# Patient Record
Sex: Female | Born: 1975
Health system: Southern US, Community
[De-identification: ages and names within clinical notes are randomized; demographics above are authoritative.]

## PROBLEM LIST (undated history)

## (undated) DIAGNOSIS — N979 Female infertility, unspecified: Secondary | ICD-10-CM

## (undated) DIAGNOSIS — R42 Dizziness and giddiness: Secondary | ICD-10-CM

## (undated) DIAGNOSIS — G43909 Migraine, unspecified, not intractable, without status migrainosus: Secondary | ICD-10-CM

## (undated) HISTORY — DX: Migraine, unspecified, not intractable, without status migrainosus: G43.909

## (undated) HISTORY — DX: Female infertility, unspecified: N97.9

## (undated) HISTORY — DX: Dizziness and giddiness: R42

---

## 2013-08-30 ENCOUNTER — Other Ambulatory Visit (HOSPITAL_COMMUNITY): Payer: Self-pay | Admitting: Gynecology

## 2013-08-30 DIAGNOSIS — Z3141 Encounter for fertility testing: Secondary | ICD-10-CM

## 2013-09-16 ENCOUNTER — Ambulatory Visit (HOSPITAL_COMMUNITY)
Admission: RE | Admit: 2013-09-16 | Discharge: 2013-09-16 | Disposition: A | Discharge status: Home / Self Care | Payer: BC Managed Care – PPO | Source: Ambulatory Visit | Attending: Gynecology | Admitting: Gynecology

## 2013-09-16 DIAGNOSIS — Z3141 Encounter for fertility testing: Secondary | ICD-10-CM

## 2013-09-16 DIAGNOSIS — N979 Female infertility, unspecified: Secondary | ICD-10-CM | POA: Insufficient documentation

## 2013-09-16 MED ORDER — IOHEXOL 300 MG/ML  SOLN
10.0000 mL | Freq: Once | INTRAMUSCULAR | Status: AC | PRN
  Administered 2013-09-16: 15 mL

## 2013-12-20 ENCOUNTER — Encounter: Payer: Self-pay | Admitting: Internal Medicine

## 2013-12-20 ENCOUNTER — Ambulatory Visit (INDEPENDENT_AMBULATORY_CARE_PROVIDER_SITE_OTHER): Payer: BC Managed Care – PPO | Admitting: Internal Medicine

## 2013-12-20 ENCOUNTER — Other Ambulatory Visit: Payer: Self-pay | Admitting: Internal Medicine

## 2013-12-20 VITALS — BP 116/78 | HR 74 | Temp 98.4°F | Ht 64.0 in | Wt 113.0 lb

## 2013-12-20 DIAGNOSIS — N809 Endometriosis, unspecified: Secondary | ICD-10-CM | POA: Diagnosis not present

## 2013-12-20 DIAGNOSIS — N979 Female infertility, unspecified: Secondary | ICD-10-CM | POA: Diagnosis not present

## 2013-12-20 DIAGNOSIS — Z2251 Carrier of viral hepatitis B: Secondary | ICD-10-CM | POA: Diagnosis not present

## 2013-12-20 DIAGNOSIS — B181 Chronic viral hepatitis B without delta-agent: Secondary | ICD-10-CM

## 2013-12-20 DIAGNOSIS — B1911 Unspecified viral hepatitis B with hepatic coma: Secondary | ICD-10-CM

## 2013-12-20 LAB — COMPREHENSIVE METABOLIC PANEL
ALBUMIN: 4.4 g/dL (ref 3.5–5.2)
ALK PHOS: 43 U/L (ref 39–117)
ALT: 11 U/L (ref 0–35)
AST: 17 U/L (ref 0–37)
BUN: 19 mg/dL (ref 6–23)
CHLORIDE: 104 meq/L (ref 96–112)
CO2: 28 mEq/L (ref 19–32)
Calcium: 9.3 mg/dL (ref 8.4–10.5)
Creat: 0.71 mg/dL (ref 0.50–1.10)
GLUCOSE: 112 mg/dL — AB (ref 70–99)
POTASSIUM: 4 meq/L (ref 3.5–5.3)
SODIUM: 139 meq/L (ref 135–145)
TOTAL PROTEIN: 6.8 g/dL (ref 6.0–8.3)
Total Bilirubin: 0.7 mg/dL (ref 0.2–1.2)

## 2013-12-20 NOTE — Patient Instructions (Signed)
Diane Schultz should be tested for Hepatitis B surface antibody. A positive antibody shows protection against Hepatitis B.

## 2013-12-20 NOTE — Progress Notes (Signed)
Patient ID: Diane Schultz, female   DOB: 06/12/1975, 38 y.o.   MRN: [id number]         [facility]  Reason for Consult: Chronic hepatitis B Referring Physician: Dr. Teodora MediciHoward Schultz  Patient Active Problem List   Diagnosis Date Noted  . Hepatitis B carrier 12/20/2013    Priority: High  . Endometriosis 12/20/2013  . Infertility, female 12/20/2013    Patient's Medications  New Prescriptions   No medications on file  Previous Medications   PRENATAL VIT-FE FUMARATE-FA (PRENATAL MULTIVITAMIN) TABS TABLET    Take 1 tablet by mouth daily at 12 noon.  Modified Medications   No medications on file  Discontinued Medications   No medications on file    Recommendations: 1. Check lab work today including hepatitis B surface antigen and antibody, hepatitis B e antigen and antibody, complete metabolic panel, hepatitis A antibody and hepatitis C antibody 2. Abdominal ultrasound with elastography 3. Follow-up in 2 weeks  Assessment: Diane Schultz has chronic hepatitis B. I will need to check lab work and abdominal ultrasound to fully stage her infection. Chronic hepatitis B poses long-term risks to her but also the risk of possible vertical transmission to her fetus if she were to become pregnant. Once I have the results of her lab work and ultrasound I will see her back to counsel her about options for management. I've also suggested that Diane BusmanAllen be tested for hepatitis B surface antibody to make sure he is protected from his previous vaccination.   HPI: Diane Schultz is a 38 y.o. female who immigrated here from [place] about 6 months ago with her husband, Diane Schultz. Her mother had hepatitis B and she recalls being told that her liver enzymes have been intermittently elevated in the past. Many years ago she recalls having hepatitis B surface antigen and antibody test done but does not recall the results. She does not recall ever having any bouts of clinical hepatitis or jaundice.  She has never been treated for hepatitis B. She states that she has been in good health. They have a 24 and a half-year-old child who has been tested for hepatitis B and is negative. The child has been vaccinated against hepatitis B.  They've been trying to conceive a second child and currently undergoing evaluation for in vitro fertilization. As part of that evaluation she had a hepatitis B viral load done which was positive. It was repeated on November 16 by Dr. Chevis PrettyMezer and was slightly elevated at 490 IU/ml.   [name] states that he has been tested and is hepatitis B negative. He has also been vaccinated many years ago.  Review of Systems: Constitutional: positive for fatigue, negative for anorexia, chills, fevers, sweats and weight loss Eyes: negative Ears, nose, mouth, throat, and face: negative Respiratory: negative Cardiovascular: negative Gastrointestinal: negative Genitourinary:negative    Past Medical History  Diagnosis Date  . Infertility, female     History  Substance Use Topics  . Smoking status: Never Smoker   . Smokeless tobacco: Never Used  . Alcohol Use: 0.0 oz/week    0 Not specified per week     Comment: occasional    Family History  Problem Relation Age of Onset  . Hepatitis B Mother    No Known Allergies  OBJECTIVE: Blood pressure 116/78, pulse 74, temperature 98.4 F (36.9 C), temperature source Oral, height 5\' 4"  (1.626 m), weight 113 lb (51.256 kg). General: She is a thin, well-dressed female in no distress Skin: Pale  skin. No rashes Lymph nodes: No palpable adenopathy Lungs: Clear Cor: Regular S1 and S2 with no murmurs Abdomen: Soft and nontender. No liver, spleen or other masses palpable   Microbiology: No results found for this or any previous visit (from the past 240 hour(s)).  Cliffton AstersJohn Klaira Pesci, MD [facility] [phone] pager   [phone] cell 12/20/2013, 4:45 PM

## 2013-12-21 LAB — HEPATITIS C ANTIBODY: HCV Ab: NEGATIVE

## 2013-12-21 LAB — HEPATITIS B SURFACE ANTIBODY,QUALITATIVE: Hep B S Ab: NEGATIVE

## 2013-12-21 LAB — HEPATITIS B SURFACE ANTIGEN: Hepatitis B Surface Ag: POSITIVE — AB

## 2013-12-21 LAB — HEPATITIS A ANTIBODY, TOTAL: HEP A TOTAL AB: NONREACTIVE

## 2013-12-21 LAB — HEPATITIS B SURF AG CONFIRMATION: HEPATITIS B SURFACE ANTIGEN CONFIRMATION: POSITIVE — AB

## 2013-12-22 LAB — HEPATITIS B E ANTIGEN: HEPATITIS BE ANTIGEN: NONREACTIVE

## 2013-12-22 LAB — HEPATITIS B E ANTIBODY: Hepatitis Be Antibody: REACTIVE — AB

## 2013-12-30 ENCOUNTER — Ambulatory Visit (HOSPITAL_COMMUNITY)
Admission: RE | Admit: 2013-12-30 | Discharge: 2013-12-30 | Disposition: A | Discharge status: Home / Self Care | Payer: BC Managed Care – PPO | Source: Ambulatory Visit | Attending: Internal Medicine | Admitting: Internal Medicine

## 2013-12-30 DIAGNOSIS — B160 Acute hepatitis B with delta-agent with hepatic coma: Secondary | ICD-10-CM | POA: Insufficient documentation

## 2013-12-30 DIAGNOSIS — B1911 Unspecified viral hepatitis B with hepatic coma: Secondary | ICD-10-CM

## 2014-01-05 ENCOUNTER — Encounter: Payer: Self-pay | Admitting: Internal Medicine

## 2014-01-05 ENCOUNTER — Ambulatory Visit (INDEPENDENT_AMBULATORY_CARE_PROVIDER_SITE_OTHER): Payer: BC Managed Care – PPO | Admitting: Internal Medicine

## 2014-01-05 VITALS — BP 92/60 | HR 66 | Temp 98.6°F | Wt 115.5 lb

## 2014-01-05 DIAGNOSIS — Z2251 Carrier of viral hepatitis B: Secondary | ICD-10-CM

## 2014-01-05 DIAGNOSIS — B181 Chronic viral hepatitis B without delta-agent: Secondary | ICD-10-CM

## 2014-01-05 NOTE — Progress Notes (Signed)
Patient ID: Diane Schultz, female   DOB: 1975-08-29, 38 y.o.   MRN: [id number]         [facility]  Patient Active Problem List   Diagnosis Date Noted  . Hepatitis B carrier 12/20/2013    Priority: High  . Endometriosis 12/20/2013  . Infertility, female 12/20/2013    Patient's Medications  New Prescriptions   No medications on file  Previous Medications   PRENATAL VIT-FE FUMARATE-FA (PRENATAL MULTIVITAMIN) TABS TABLET    Take 1 tablet by mouth daily at 12 noon.  Modified Medications   No medications on file  Discontinued Medications   No medications on file    Subjective: Jaelen is in with her husband, [name], to review her test results. She continues to feel well. She continues to work on becoming pregnant but is currently not pregnant. [name] is scheduled for his routine physical in January and plans to have his hepatitis B surface antibody tested at that time.  Review of Systems: Pertinent items are noted in HPI.  Past Medical History  Diagnosis Date  . Infertility, female     History  Substance Use Topics  . Smoking status: Never Smoker   . Smokeless tobacco: Never Used  . Alcohol Use: 0.0 oz/week    0 Not specified per week     Comment: occasional    Family History  Problem Relation Age of Onset  . Hepatitis B Mother     No Known Allergies  Objective: Temp: 98.6 F (37 C) (12/23 1539) Temp Source: Oral (12/23 1539) BP: 92/60 mmHg (12/23 1539) Pulse Rate: 66 (12/23 1539)  General: She is in good spirits and her exam is unchanged and normal  Lab Results  Previous lab results: Hepatitis B surface antigen positive, hepatitis B surface antibody negative, hepatitis B DNA 490 IU/ml 12/20/2013:  Hepatitis B e antigen negative Hepatitis B e antibody positive Hepatitis A antibody negative Hepatitis C antibody negative  Lab Results  Component Value Date   ALT 11 12/20/2013   AST 17 12/20/2013   ALKPHOS 43 12/20/2013   BILITOT 0.7 12/20/2013   ULTRASOUND HEPATIC ELASTOGRAPHY 12/30/2013  TECHNIQUE: Sonography of the upper abdomen was performed. In addition, ultrasound elastography evaluation of the liver was performed. A region of interest was placed within the right lobe of the liver. Following application of a compressive sonographic pulse, shear waves were detected in the adjacent hepatic tissue and the shear wave velocity was calculated. Multiple assessments were performed at the selected site. Median shear wave velocity is correlated to a Metavir fibrosis score.  COMPARISON: None.  FINDINGS: ULTRASOUND ABDOMEN  Gallbladder: No gallstones, gallbladder wall thickening, or pericholecystic fluid. Negative sonographic Murphy's sign.  Common bile duct: Diameter: 3 mm  Liver: No focal lesion identified. Within normal limits in parenchymal echogenicity.  IVC: No abnormality visualized.  Pancreas: Visualized portion unremarkable.  Spleen: Size and appearance within normal limits.  Right Kidney: Length: 11.7 cm. 8 x 6 x 6 mm upper pole cyst. No hydronephrosis.  Left Kidney: Length: 10.9 cm. No mass or hydronephrosis.  Abdominal aorta: No aneurysm visualized.  Other findings: None.  ULTRASOUND HEPATIC ELASTOGRAPHY  Device: Siemens Helix VTQ  Transducer 4V1  Patient position: Left lateral decubitus  Number of measurements: 10  Hepatic Segment: 8  Median velocity: 1.65 m/sec  IQR: 0.41  IQR/Median velocity ratio 0.25  Corresponding Metavir fibrosis score: F2/F3  Risk of fibrosis: Moderate  Limitations of exam: Additional testing appropriate.  Pertinent findings noted on  other imaging exams: None  Please note that abnormal shear wave velocities may also be identified in clinical settings other than with hepatic fibrosis, such as: acute hepatitis, elevated right heart and central venous pressures including use of beta blockers,  veno-occlusive disease (Budd-Chiari), infiltrative processes such as mastocytosis/amyloidosis/infiltrative tumor, extrahepatic cholestasis, in the post-prandial state, and liver transplantation. Correlation with patient history, laboratory data, and clinical condition recommended.  IMPRESSION: Negative abdominal ultrasound.  Median hepatic shear wave velocity is calculated at 1.65 m/sec.  Corresponding Metavir fibrosis score is F2/F3.  Risk of fibrosis is moderate.  Follow-up: Additional testing appropriate.   Electronically Signed  By: Charline BillsSriyesh Krishnan M.D.  On: 12/30/2013 11:26    Assessment: She has chronic hepatitis B with negative E antigen, normal liver enzymes and a very low hepatitis B DNA viral load. This puts her at low risk for vertical transmission if she becomes pregnant. Current guidelines do not recommend therapy to reduce the risk of vertical transmission but if she does become pregnant and the liver her baby should receive hepatitis B immune globulin and the standard hepatitis B vaccination series.  Her fibrosis score of F2/F3 needs further evaluation. I [facility] [place]  Plan: 1. Start hepatitis A vaccine 2. Hepatology clinic referral 3. Follow-up here after that evaluation   Cliffton AstersJohn Garlene Apperson, MD [facility] [phone] pager   [phone] cell 01/05/2014, 3:59 PM

## 2014-01-12 ENCOUNTER — Telehealth: Payer: Self-pay | Admitting: *Deleted

## 2014-01-12 NOTE — Telephone Encounter (Signed)
Pt will be called to arrange appointment date and time.  [facility] will notify [facility] with date and time of appt.

## 2014-01-20 ENCOUNTER — Telehealth: Payer: Self-pay | Admitting: *Deleted

## 2014-01-20 NOTE — Telephone Encounter (Signed)
Needing to make f/u appt w/ Dr. Orvan Falconerampbell.  [facility].  Dr Orvan Falconerampbell wants to see her 1-2 weeks after this appt.  Left message to call for appt first or second week in March 2016.

## 2014-01-31 ENCOUNTER — Telehealth: Payer: Self-pay | Admitting: *Deleted

## 2014-01-31 NOTE — Telephone Encounter (Signed)
Patient's husband, [name] called and left voice mail regarding a letter of clearance from Dr. Orvan Falconerampbell for IVF treatments. Called patient back and left voice mail stating I did not see a mention of the letter in Dr. Blair Dolphinampbell's note and they should request it from the hepatologist that the patient is scheduled to see on 03/11/14. Dr. Orvan Falconerampbell does want to see patient 2nd week in March and advised him to call back to schedule this appointment. Wendall MolaJacqueline Cockerham CMA

## 2014-01-31 NOTE — Telephone Encounter (Signed)
Patient's husband called back stating he did discuss with Dr. Orvan Falconerampbell a clearance letter to proceed with IVF; please advise.

## 2014-01-31 NOTE — Telephone Encounter (Signed)
error 

## 2014-02-03 ENCOUNTER — Encounter: Payer: Self-pay | Admitting: Internal Medicine

## 2014-02-03 NOTE — Telephone Encounter (Signed)
Patient notified that letter was faxed electronically to Dr. Chevis PrettyMezer at [facility] and a copy was mailed to his home. Diane MolaJacqueline Cockerham

## 2015-11-25 DIAGNOSIS — Z23 Encounter for immunization: Secondary | ICD-10-CM | POA: Diagnosis not present

## 2016-04-26 DIAGNOSIS — N809 Endometriosis, unspecified: Secondary | ICD-10-CM | POA: Diagnosis not present

## 2016-04-26 DIAGNOSIS — R102 Pelvic and perineal pain: Secondary | ICD-10-CM | POA: Diagnosis not present

## 2016-05-22 DIAGNOSIS — Z30430 Encounter for insertion of intrauterine contraceptive device: Secondary | ICD-10-CM | POA: Diagnosis not present

## 2016-05-22 DIAGNOSIS — Z3202 Encounter for pregnancy test, result negative: Secondary | ICD-10-CM | POA: Diagnosis not present

## 2016-05-30 DIAGNOSIS — Z6820 Body mass index (BMI) 20.0-20.9, adult: Secondary | ICD-10-CM | POA: Diagnosis not present

## 2016-05-30 DIAGNOSIS — Z01419 Encounter for gynecological examination (general) (routine) without abnormal findings: Secondary | ICD-10-CM | POA: Diagnosis not present

## 2016-08-28 DIAGNOSIS — Z1231 Encounter for screening mammogram for malignant neoplasm of breast: Secondary | ICD-10-CM | POA: Diagnosis not present

## 2016-08-28 DIAGNOSIS — Z30431 Encounter for routine checking of intrauterine contraceptive device: Secondary | ICD-10-CM | POA: Diagnosis not present

## 2016-11-19 DIAGNOSIS — G245 Blepharospasm: Secondary | ICD-10-CM | POA: Diagnosis not present

## 2016-11-19 DIAGNOSIS — H2513 Age-related nuclear cataract, bilateral: Secondary | ICD-10-CM | POA: Diagnosis not present

## 2016-12-09 DIAGNOSIS — R05 Cough: Secondary | ICD-10-CM | POA: Diagnosis not present

## 2017-06-30 DIAGNOSIS — Z1231 Encounter for screening mammogram for malignant neoplasm of breast: Secondary | ICD-10-CM | POA: Diagnosis not present

## 2017-07-07 DIAGNOSIS — N7011 Chronic salpingitis: Secondary | ICD-10-CM | POA: Diagnosis not present

## 2017-10-20 DIAGNOSIS — D509 Iron deficiency anemia, unspecified: Secondary | ICD-10-CM | POA: Diagnosis not present

## 2017-10-20 DIAGNOSIS — N7011 Chronic salpingitis: Secondary | ICD-10-CM | POA: Diagnosis not present

## 2017-10-31 DIAGNOSIS — R42 Dizziness and giddiness: Secondary | ICD-10-CM | POA: Diagnosis not present

## 2017-10-31 DIAGNOSIS — J343 Hypertrophy of nasal turbinates: Secondary | ICD-10-CM | POA: Diagnosis not present

## 2017-10-31 DIAGNOSIS — Z7289 Other problems related to lifestyle: Secondary | ICD-10-CM | POA: Diagnosis not present

## 2018-01-01 DIAGNOSIS — N7011 Chronic salpingitis: Secondary | ICD-10-CM | POA: Diagnosis not present

## 2018-01-01 DIAGNOSIS — N809 Endometriosis, unspecified: Secondary | ICD-10-CM | POA: Diagnosis not present

## 2018-01-01 DIAGNOSIS — N915 Oligomenorrhea, unspecified: Secondary | ICD-10-CM | POA: Diagnosis not present

## 2018-09-18 DIAGNOSIS — N801 Endometriosis of ovary: Secondary | ICD-10-CM | POA: Diagnosis not present

## 2018-10-12 DIAGNOSIS — Z23 Encounter for immunization: Secondary | ICD-10-CM | POA: Diagnosis not present

## 2018-10-12 DIAGNOSIS — Z13220 Encounter for screening for lipoid disorders: Secondary | ICD-10-CM | POA: Diagnosis not present

## 2018-10-12 DIAGNOSIS — Z0000 Encounter for general adult medical examination without abnormal findings: Secondary | ICD-10-CM | POA: Diagnosis not present

## 2018-10-13 DIAGNOSIS — Z01419 Encounter for gynecological examination (general) (routine) without abnormal findings: Secondary | ICD-10-CM | POA: Diagnosis not present

## 2018-10-13 DIAGNOSIS — Z1231 Encounter for screening mammogram for malignant neoplasm of breast: Secondary | ICD-10-CM | POA: Diagnosis not present

## 2018-10-13 DIAGNOSIS — Z3202 Encounter for pregnancy test, result negative: Secondary | ICD-10-CM | POA: Diagnosis not present

## 2018-10-13 DIAGNOSIS — R5383 Other fatigue: Secondary | ICD-10-CM | POA: Diagnosis not present

## 2018-10-13 DIAGNOSIS — N915 Oligomenorrhea, unspecified: Secondary | ICD-10-CM | POA: Diagnosis not present

## 2018-10-13 DIAGNOSIS — Z6822 Body mass index (BMI) 22.0-22.9, adult: Secondary | ICD-10-CM | POA: Diagnosis not present

## 2018-10-27 DIAGNOSIS — G4700 Insomnia, unspecified: Secondary | ICD-10-CM | POA: Diagnosis not present

## 2019-02-04 ENCOUNTER — Encounter (HOSPITAL_BASED_OUTPATIENT_CLINIC_OR_DEPARTMENT_OTHER): Payer: Self-pay

## 2019-02-04 ENCOUNTER — Emergency Department (HOSPITAL_BASED_OUTPATIENT_CLINIC_OR_DEPARTMENT_OTHER)
Admission: EM | Admit: 2019-02-04 | Discharge: 2019-02-04 | Disposition: A | Discharge status: Home / Self Care | Payer: BC Managed Care – PPO | Attending: Emergency Medicine | Admitting: Emergency Medicine

## 2019-02-04 ENCOUNTER — Other Ambulatory Visit: Payer: Self-pay

## 2019-02-04 ENCOUNTER — Emergency Department (HOSPITAL_BASED_OUTPATIENT_CLINIC_OR_DEPARTMENT_OTHER): Payer: BC Managed Care – PPO

## 2019-02-04 ENCOUNTER — Inpatient Hospital Stay: Admission: RE | Admit: 2019-02-04 | Payer: Self-pay | Source: Ambulatory Visit

## 2019-02-04 ENCOUNTER — Other Ambulatory Visit: Payer: Self-pay | Admitting: Physician Assistant

## 2019-02-04 DIAGNOSIS — N202 Calculus of kidney with calculus of ureter: Secondary | ICD-10-CM | POA: Diagnosis not present

## 2019-02-04 DIAGNOSIS — R109 Unspecified abdominal pain: Secondary | ICD-10-CM | POA: Diagnosis not present

## 2019-02-04 DIAGNOSIS — N201 Calculus of ureter: Secondary | ICD-10-CM | POA: Diagnosis not present

## 2019-02-04 DIAGNOSIS — R1032 Left lower quadrant pain: Secondary | ICD-10-CM | POA: Diagnosis not present

## 2019-02-04 LAB — COMPREHENSIVE METABOLIC PANEL
ALT: 26 U/L (ref 0–44)
AST: 25 U/L (ref 15–41)
Albumin: 4.7 g/dL (ref 3.5–5.0)
Alkaline Phosphatase: 51 U/L (ref 38–126)
Anion gap: 9 (ref 5–15)
BUN: 16 mg/dL (ref 6–20)
CO2: 21 mmol/L — ABNORMAL LOW (ref 22–32)
Calcium: 9.1 mg/dL (ref 8.9–10.3)
Chloride: 104 mmol/L (ref 98–111)
Creatinine, Ser: 0.92 mg/dL (ref 0.44–1.00)
GFR calc Af Amer: >60 mL/min (ref >60)
GFR calc non Af Amer: >60 mL/min (ref >60)
Glucose, Bld: 107 mg/dL — ABNORMAL HIGH (ref 70–99)
Potassium: 3.5 mmol/L (ref 3.5–5.1)
Sodium: 134 mmol/L — ABNORMAL LOW (ref 135–145)
Total Bilirubin: 1.6 mg/dL — ABNORMAL HIGH (ref 0.3–1.2)
Total Protein: 7.8 g/dL (ref 6.5–8.1)

## 2019-02-04 LAB — CBC WITH DIFFERENTIAL/PLATELET
Abs Immature Granulocytes: 0.02 10*3/uL (ref 0.00–0.07)
Basophils Absolute: 0 10*3/uL (ref 0.0–0.1)
Basophils Relative: 0 %
Eosinophils Absolute: 0 10*3/uL (ref 0.0–0.5)
Eosinophils Relative: 0 %
HCT: 36.6 % (ref 36.0–46.0)
Hemoglobin: 12.2 g/dL (ref 12.0–15.0)
Immature Granulocytes: 0 %
Lymphocytes Relative: 8 %
Lymphs Abs: 0.7 10*3/uL (ref 0.7–4.0)
MCH: 32.8 pg (ref 26.0–34.0)
MCHC: 33.3 g/dL (ref 30.0–36.0)
MCV: 98.4 fL (ref 80.0–100.0)
Monocytes Absolute: 0.2 10*3/uL (ref 0.1–1.0)
Monocytes Relative: 3 %
Neutro Abs: 7.5 10*3/uL (ref 1.7–7.7)
Neutrophils Relative %: 89 %
Platelets: 207 10*3/uL (ref 150–400)
RBC: 3.72 MIL/uL — ABNORMAL LOW (ref 3.87–5.11)
RDW: 11.7 % (ref 11.5–15.5)
WBC: 8.4 10*3/uL (ref 4.0–10.5)
nRBC: 0 % (ref 0.0–0.2)

## 2019-02-04 LAB — URINALYSIS, ROUTINE W REFLEX MICROSCOPIC
Bilirubin Urine: NEGATIVE
Glucose, UA: NEGATIVE mg/dL
Ketones, ur: 40 mg/dL — AB
Leukocytes,Ua: NEGATIVE
Nitrite: NEGATIVE
Protein, ur: NEGATIVE mg/dL
Specific Gravity, Urine: 1.025 (ref 1.005–1.030)
pH: 6 (ref 5.0–8.0)

## 2019-02-04 LAB — PREGNANCY, URINE: Preg Test, Ur: NEGATIVE

## 2019-02-04 LAB — URINALYSIS, MICROSCOPIC (REFLEX): WBC, UA: NONE SEEN WBC/hpf (ref 0–5)

## 2019-02-04 MED ORDER — IBUPROFEN 600 MG PO TABS: 600.0000 mg | ORAL_TABLET | Freq: Four times a day (QID) | ORAL | 0 refills | Status: DC | PRN

## 2019-02-04 MED ORDER — SODIUM CHLORIDE 0.9 % IV BOLUS
1000.0000 mL | Freq: Once | INTRAVENOUS | Status: AC
  Administered 2019-02-04: 1000 mL via INTRAVENOUS

## 2019-02-04 MED ORDER — TAMSULOSIN HCL 0.4 MG PO CAPS: 0.4000 mg | ORAL_CAPSULE | Freq: Every day | ORAL | 0 refills | Status: DC

## 2019-02-04 MED ORDER — ONDANSETRON HCL 4 MG/2ML IJ SOLN
4.0000 mg | Freq: Once | INTRAMUSCULAR | Status: AC
  Administered 2019-02-04: 4 mg via INTRAVENOUS
  Filled 2019-02-04: qty 2

## 2019-02-04 MED ORDER — HYDROCODONE-ACETAMINOPHEN 5-325 MG PO TABS: 1.0000 | ORAL_TABLET | Freq: Four times a day (QID) | ORAL | 0 refills | Status: DC | PRN

## 2019-02-04 MED ORDER — HYDROMORPHONE HCL 1 MG/ML IJ SOLN
1.0000 mg | Freq: Once | INTRAMUSCULAR | Status: AC
  Administered 2019-02-04: 1 mg via INTRAVENOUS
  Filled 2019-02-04: qty 1

## 2019-02-04 MED ORDER — KETOROLAC TROMETHAMINE 30 MG/ML IJ SOLN
30.0000 mg | Freq: Once | INTRAMUSCULAR | Status: AC
  Administered 2019-02-04: 30 mg via INTRAVENOUS
  Filled 2019-02-04: qty 1

## 2019-02-04 MED ORDER — ONDANSETRON HCL 4 MG PO TABS: 4.0000 mg | ORAL_TABLET | Freq: Three times a day (TID) | ORAL | 0 refills | Status: DC | PRN

## 2019-02-04 NOTE — Discharge Instructions (Addendum)
1. Medications: Alternate 600 mg of ibuprofen and [phone] mg of Tylenol every 3 hours as needed for pain. Do not exceed 4000 mg of Tylenol daily.  Take ibuprofen with food to avoid upset stomach issues.  You can take Norco (hydrocodone-acetaminophen) as needed for severe pain but do not drive, drink alcohol, or operate heavy machinery while taking this medicine as it can cause drowsiness.  Be aware this medicine also contains Tylenol.  Take Zofran as needed for nausea.  Let this medicine dissolve under your tongue and wait around 10-15 minutes before eating or drinking after taking this medication to give it time to work.    Take Flomax as prescribed.  This medicine will open up the ureter(the tube connecting the bladder to the kidney) to allow for easier passage of the kidney stone.  Be aware this medication can cause a drop in your blood pressure so be careful when going from laying or sitting to standing as you may feel lightheaded.  If you feel very fatigued, lightheaded, or have persistently low blood pressures, stop taking this medication entirely.  You do not have to keep taking the Flomax once the stone is passed.  2. Treatment: rest, drink plenty of fluids.  Strain the urine to attempt to capture your kidney stone  3. Follow Up: Please followup with urology as soon as possible (preferably this week) for discussion of your diagnoses and further evaluation after today's visit; Please return to the ER for persistent vomiting, high fevers or worsening symptoms

## 2019-02-04 NOTE — ED Provider Notes (Signed)
[place] EMERGENCY DEPARTMENT Provider Note   CSN: [id number] Arrival date & time: 02/04/19  1445     History Chief Complaint  Patient presents with  . Flank Pain    Diane Schultz is a 44 y.o. female with history of endometriosis presents for evaluation of acute onset, progressively worsening left flank pain for 4 days.  Reports that symptoms have worsened since last night.  Pain is intermittent, sharp and stabbing.  It radiates from the left groin up to the left flank and left side of the low back.  No recent injuries.  She has felt subjective chills but when she checked her temperature she was afebrile.  Did have 2 episodes of nonbloody nonbilious emesis yesterday which she states is not unusual for her when she has severe pain.  She denies any urinary symptoms, vaginal itching, bleeding, or discharge.  She took 400 mg of ibuprofen yesterday with resolution of symptoms but they returned 3 hours later.  Denies bowel or bladder incontinence, saddle anesthesia, or history of IV drug use.  The history is provided by the patient.       Past Medical History:  Diagnosis Date  . Infertility, female     Patient Active Problem List   Diagnosis Date Noted  . Hepatitis B carrier ([place]) 12/20/2013  . Endometriosis 12/20/2013  . Infertility, female 12/20/2013    History reviewed. No pertinent surgical history.   OB History   No obstetric history on file.     Family History  Problem Relation Age of Onset  . Hepatitis B Mother     Social History   Tobacco Use  . Smoking status: Never Smoker  . Smokeless tobacco: Never Used  Substance Use Topics  . Alcohol use: Not Currently    Alcohol/week: 0.0 standard drinks    Comment: occasional  . Drug use: No    Home Medications Prior to Admission medications   Medication Sig Start Date End Date Taking? Authorizing Provider  HYDROcodone-acetaminophen (NORCO/VICODIN) 5-325 MG tablet Take 1 tablet by mouth every 6 (six)  hours as needed for severe pain. 02/04/19   Nelle Sayed A, PA-C  ibuprofen (ADVIL) 600 MG tablet Take 1 tablet (600 mg total) by mouth every 6 (six) hours as needed. 02/04/19   Nils Flack, Santiel Topper A, PA-C  ondansetron (ZOFRAN) 4 MG tablet Take 1 tablet (4 mg total) by mouth every 8 (eight) hours as needed for nausea or vomiting. 02/04/19   Rodell Perna A, PA-C  Prenatal Vit-Fe Fumarate-FA (PRENATAL MULTIVITAMIN) TABS tablet Take 1 tablet by mouth daily at 12 noon.    [provider]  tamsulosin (FLOMAX) 0.4 MG CAPS capsule Take 1 capsule (0.4 mg total) by mouth daily after supper. 02/04/19   Rodell Perna A, PA-C    Allergies    Patient has no known allergies.  Review of Systems   Review of Systems  Constitutional: Positive for chills. Negative for fever.  Respiratory: Negative for shortness of breath.   Cardiovascular: Negative for chest pain.  Gastrointestinal: Positive for abdominal pain, constipation, nausea and vomiting. Negative for diarrhea.  Genitourinary: Positive for flank pain. Negative for vaginal bleeding, vaginal discharge and vaginal pain.  All other systems reviewed and are negative.   Physical Exam Updated Vital Signs BP 124/72 (BP Location: Left Arm)   Pulse 61   Temp 98.8 F (37.1 C) (Oral)   Resp 15   Ht 5\' 3"  (1.6 m)   Wt 56.7 kg   LMP 01/03/2019  SpO2 95%   BMI 22.14 kg/m   Physical Exam Vitals and nursing note reviewed.  Constitutional:      General: She is not in acute distress.    Appearance: She is well-developed.  HENT:     Head: Normocephalic and atraumatic.  Eyes:     General:        Right eye: No discharge.        Left eye: No discharge.     Conjunctiva/sclera: Conjunctivae normal.  Neck:     Vascular: No JVD.     Trachea: No tracheal deviation.  Cardiovascular:     Rate and Rhythm: Normal rate and regular rhythm.     Pulses: Normal pulses.     Comments: 2+ radial and DP/PT pulses bilaterally, no lower extremity edema, no palpable cords,  compartments are soft  Pulmonary:     Effort: Pulmonary effort is normal.     Breath sounds: Normal breath sounds.  Abdominal:     General: Abdomen is flat. Bowel sounds are normal. There is no distension.     Palpations: Abdomen is soft.     Tenderness: There is abdominal tenderness in the left upper quadrant and left lower quadrant. There is left CVA tenderness. There is no guarding or rebound.     Comments: Mild discomfort on palpation of the left upper and lower quadrants of the abdomen and left flank.  Musculoskeletal:     Cervical back: Neck supple.     Comments: No midline lumbar spine tenderness, left paralumbar muscle tenderness.  No deformity, crepitus, or step-off.  5/5 strength of BLE major muscle groups.  Skin:    General: Skin is warm and dry.     Findings: No erythema.  Neurological:     Mental Status: She is alert.  Psychiatric:        Behavior: Behavior normal.     ED Results / Procedures / Treatments   Labs (all labs ordered are listed, but only abnormal results are displayed) Labs Reviewed  URINALYSIS, ROUTINE W REFLEX MICROSCOPIC - Abnormal; Notable for the following components:      Result Value   Hgb urine dipstick MODERATE (*)    Ketones, ur 40 (*)    All other components within normal limits  COMPREHENSIVE METABOLIC PANEL - Abnormal; Notable for the following components:   Sodium 134 (*)    CO2 21 (*)    Glucose, Bld 107 (*)    Total Bilirubin 1.6 (*)    All other components within normal limits  CBC WITH DIFFERENTIAL/PLATELET - Abnormal; Notable for the following components:   RBC 3.72 (*)    All other components within normal limits  URINALYSIS, MICROSCOPIC (REFLEX) - Abnormal; Notable for the following components:   Bacteria, UA FEW (*)    All other components within normal limits  PREGNANCY, URINE    EKG None  Radiology CT Renal Stone Study  Result Date: 02/04/2019 CLINICAL DATA:  Left-sided flank pain for several days EXAM: CT ABDOMEN  AND PELVIS WITHOUT CONTRAST TECHNIQUE: Multidetector CT imaging of the abdomen and pelvis was performed following the standard protocol without IV contrast. COMPARISON:  None. FINDINGS: Lower chest: No acute abnormality. Hepatobiliary: No focal liver abnormality is seen. No gallstones, gallbladder wall thickening, or biliary dilatation. Pancreas: Unremarkable. No pancreatic ductal dilatation or surrounding inflammatory changes. Spleen: Normal in size without focal abnormality. Adrenals/Urinary Tract: Adrenal glands are within normal limits. The left kidney demonstrates hydronephrosis and proximal hydroureter secondary to a 5 mm proximal  left ureteral stone best seen on image number 41 of series 4 and image number 41 of series 2. Scattered tiny nonobstructing renal calculi are noted bilaterally. The right ureter is within normal limits. The bladder is decompressed. Stomach/Bowel: The appendix is within normal limits. No obstructive or inflammatory changes of the large or small bowel are seen. The stomach is within normal limits. Vascular/Lymphatic: No significant vascular findings are present. No enlarged abdominal or pelvic lymph nodes. Reproductive: Uterus is somewhat retroflexed. No adnexal mass is noted. Other: No abdominal wall hernia or abnormality. No abdominopelvic ascites. Musculoskeletal: No acute or significant osseous findings. IMPRESSION: 5 mm proximal left ureteral stone with obstructive change. Tiny nonobstructing renal stones bilaterally. No other focal abnormality is noted. Electronically Signed   By: Alcide Clever M.D.   On: 02/04/2019 15:43    Procedures Procedures (including critical care time)  Medications Ordered in ED Medications  HYDROmorphone (DILAUDID) injection 1 mg (1 mg Intravenous Given 02/04/19 1545)  ondansetron (ZOFRAN) injection 4 mg (4 mg Intravenous Given 02/04/19 1545)  sodium chloride 0.9 % bolus 1,000 mL (0 mLs Intravenous Stopped 02/04/19 1721)  ketorolac (TORADOL) 30  MG/ML injection 30 mg (30 mg Intravenous Given 02/04/19 1609)    ED Course  I have reviewed the triage vital signs and the nursing notes.  Pertinent labs & imaging results that were available during my care of the patient were reviewed by me and considered in my medical decision making (see chart for details).    MDM Rules/Calculators/A&P                      Patient presenting for evaluation of left flank pain or abdominal pain for 4 days.  She is afebrile, vital signs are stable.  She is nontoxic in appearance.  No peritoneal signs on examination of the abdomen.  No midline lumbar spine tenderness and no red flag signs concerning for cauda equina or spinal abscess.  She is neurovascularly intact.  Lab work today reviewed by me shows no leukocytosis, no anemia, no metabolic derangements, no renal insufficiency.  Lipase and AST/ALT are within normal limits.  UA shows moderate hemoglobin, mild ketonuria and a few bacteria but not obviously infected. CT scan shows 5 mm proximal obstructing left ureteral stone as well as tiny nonobstructing renal stones bilaterally.  On reevaluation patient resting comfortably in no apparent distress, reports that she is feeling much better.  She was given IV fluids, Zofran, Dilaudid, Toradol in the ED.  She is tolerating p.o. fluids in the ED without difficulty.  Serial abdominal examinations are benign.  Discussed close follow-up with urology.  Will discharge with course of Flomax, Zofran, ibuprofen, as well as small amount of hydrocodone for severe breakthrough pain.  Advised of appropriate use of medications.  She will follow-up with urology on an outpatient basis.  Discussed strict ED return precautions. Patient verbalized understanding of and agreement with plan and is safe for discharge home at this time.  Final Clinical Impression(s) / ED Diagnoses Final diagnoses:  Ureterolithiasis    Rx / DC Orders ED Discharge Orders         Ordered    ondansetron  (ZOFRAN) 4 MG tablet  Every 8 hours PRN     02/04/19 1722    HYDROcodone-acetaminophen (NORCO/VICODIN) 5-325 MG tablet  Every 6 hours PRN     02/04/19 1722    tamsulosin (FLOMAX) 0.4 MG CAPS capsule  Daily after supper     02/04/19 1722  ibuprofen (ADVIL) 600 MG tablet  Every 6 hours PRN     02/04/19 1722           Bennye Alm 02/04/19 1742    Raeford Razor, MD 02/04/19 2317

## 2019-02-04 NOTE — ED Triage Notes (Signed)
Pt c/o L lumbar/flank pain with occasional radiation to the groin. Pain has been present since 1/18, but became severe last PM. Denies injury, urinary symptoms.

## 2019-02-04 NOTE — ED Notes (Signed)
Gave pt ginger ale  per RN for fluid challange

## 2019-02-10 DIAGNOSIS — N200 Calculus of kidney: Secondary | ICD-10-CM | POA: Diagnosis not present

## 2019-03-10 DIAGNOSIS — N200 Calculus of kidney: Secondary | ICD-10-CM | POA: Diagnosis not present

## 2019-03-29 DIAGNOSIS — N200 Calculus of kidney: Secondary | ICD-10-CM | POA: Diagnosis not present

## 2019-03-29 DIAGNOSIS — R52 Pain, unspecified: Secondary | ICD-10-CM | POA: Diagnosis not present

## 2019-03-29 DIAGNOSIS — R112 Nausea with vomiting, unspecified: Secondary | ICD-10-CM | POA: Diagnosis not present

## 2019-04-06 DIAGNOSIS — R3121 Asymptomatic microscopic hematuria: Secondary | ICD-10-CM | POA: Diagnosis not present

## 2019-04-06 DIAGNOSIS — N202 Calculus of kidney with calculus of ureter: Secondary | ICD-10-CM | POA: Diagnosis not present

## 2019-04-06 DIAGNOSIS — R8271 Bacteriuria: Secondary | ICD-10-CM | POA: Diagnosis not present

## 2019-04-14 DIAGNOSIS — R21 Rash and other nonspecific skin eruption: Secondary | ICD-10-CM | POA: Diagnosis not present

## 2019-04-14 DIAGNOSIS — N200 Calculus of kidney: Secondary | ICD-10-CM | POA: Diagnosis not present

## 2019-04-20 DIAGNOSIS — N201 Calculus of ureter: Secondary | ICD-10-CM | POA: Diagnosis not present

## 2019-05-28 DIAGNOSIS — H6002 Abscess of left external ear: Secondary | ICD-10-CM | POA: Diagnosis not present

## 2019-06-15 DIAGNOSIS — Z03818 Encounter for observation for suspected exposure to other biological agents ruled out: Secondary | ICD-10-CM | POA: Diagnosis not present

## 2019-07-26 DIAGNOSIS — L720 Epidermal cyst: Secondary | ICD-10-CM | POA: Diagnosis not present

## 2019-07-30 DIAGNOSIS — Q181 Preauricular sinus and cyst: Secondary | ICD-10-CM | POA: Diagnosis not present

## 2019-08-02 DIAGNOSIS — H9203 Otalgia, bilateral: Secondary | ICD-10-CM | POA: Diagnosis not present

## 2019-08-05 ENCOUNTER — Encounter: Payer: Self-pay | Admitting: Plastic Surgery

## 2019-08-05 ENCOUNTER — Other Ambulatory Visit: Payer: Self-pay

## 2019-08-05 ENCOUNTER — Ambulatory Visit (INDEPENDENT_AMBULATORY_CARE_PROVIDER_SITE_OTHER): Payer: BC Managed Care – PPO | Admitting: Plastic Surgery

## 2019-08-05 VITALS — BP 133/84 | HR 67 | Temp 98.8°F | Ht 64.0 in | Wt 125.0 lb

## 2019-08-05 DIAGNOSIS — M792 Neuralgia and neuritis, unspecified: Secondary | ICD-10-CM

## 2019-08-05 DIAGNOSIS — L989 Disorder of the skin and subcutaneous tissue, unspecified: Secondary | ICD-10-CM | POA: Diagnosis not present

## 2019-08-05 MED ORDER — GABAPENTIN 100 MG PO CAPS: 100.0000 mg | ORAL_CAPSULE | Freq: Three times a day (TID) | ORAL | 3 refills | Status: DC

## 2019-08-05 MED ORDER — SULFAMETHOXAZOLE-TRIMETHOPRIM 800-160 MG PO TABS: 1.0000 | ORAL_TABLET | Freq: Two times a day (BID) | ORAL | 0 refills | Status: DC

## 2019-08-05 NOTE — Progress Notes (Signed)
133

## 2019-08-05 NOTE — Progress Notes (Signed)
Referring Provider Aris Lot, MD [address] [place],  [place] [address]   CC:  Chief Complaint  Patient presents with  . Advice Only      Diane Schultz is an 44 y.o. female.  HPI: Patient presents to discuss bilateral ear cyst.  She has had cyst taken off from around the earlobe on both sides in [place] over the past couple months.  One of them had to be reexcised.  She also reports some significant pain starting in the left earlobe extending up the helical rim on that side.  She describes it as a burning pain it is constant and can be very painful.  It is interfering with her sleep.  She also wants me to look at one spot that is behind her right ear that she feels may be draining.  No Known Allergies  Outpatient Encounter Medications as of 08/05/2019  Medication Sig  . cephALEXin (KEFLEX) 500 MG capsule Take 500 mg by mouth 2 (two) times daily.  Marland Kitchen HYDROcodone-acetaminophen (NORCO/VICODIN) 5-325 MG tablet Take 1 tablet by mouth every 6 (six) hours as needed for severe pain.  Marland Kitchen ibuprofen (ADVIL) 600 MG tablet Take 1 tablet (600 mg total) by mouth every 6 (six) hours as needed.  . ondansetron (ZOFRAN) 4 MG tablet Take 1 tablet (4 mg total) by mouth every 8 (eight) hours as needed for nausea or vomiting.  . Prenatal Vit-Fe Fumarate-FA (PRENATAL MULTIVITAMIN) TABS tablet Take 1 tablet by mouth daily at 12 noon.  . tamsulosin (FLOMAX) 0.4 MG CAPS capsule Take 1 capsule (0.4 mg total) by mouth daily after supper.   No facility-administered encounter medications on file as of 08/05/2019.     Past Medical History:  Diagnosis Date  . Infertility, female     No past surgical history on file.  Family History  Problem Relation Age of Onset  . Hepatitis B Mother     Social History   Social History Narrative  . Not on file     Review of Systems General: Denies fevers, chills, weight loss CV: Denies chest pain, shortness of breath, palpitations  Physical Exam Vitals  with BMI 08/05/2019 02/04/2019 02/04/2019  Height 5\' 4"  - -  Weight 125 lbs - -  BMI 21.45 - -  Systolic 133 124  Diastolic 84 72 79  Pulse 67 61 68    General:  No acute distress,  Alert and oriented, Non-Toxic, Normal speech and affect On exam she has a healed surgical incision right where the earlobe meets the face on the left side.  I do not see any cyst or sinus in this area.  There is no redness inflammation or signs of infection.  There is also a well-healed incision at the helical root on that side with no signs of any complication. On the right side she has a small draining sinus posterior to the right earlobe.  There is no erythema or signs of infection.  The drainage is somewhat purulent and coming through a small punctum.  There is also a incision in that same area that healed with a spitting Prolene stitch that I removed.  Assessment/Plan Patient presents with a draining sinus in the right posterior auricular area that I suspect is like a draining cyst.  It is quite small however.  On the left ear I suspect her pain may be due to irritation of the great auricular nerve as she says it started after her cyst excision that was done in 001.  I am planning to give her a course of Bactrim and a trial of gabapentin to see if this helps with her symptoms.  I will plan to see her back in around 3 weeks.  Allena Napoleon 08/05/2019, 4:57 PM

## 2019-08-06 ENCOUNTER — Telehealth: Payer: Self-pay

## 2019-08-06 NOTE — Telephone Encounter (Signed)
Patient's husband called to request if we could order an ultrasound for her ears per the discussion during the visit on 08/06/2019.

## 2019-08-06 NOTE — Telephone Encounter (Signed)
During the discussion I explained that ultrasound would not be useful for her.  So no plans to order that.  Thanks.

## 2019-08-09 DIAGNOSIS — H9203 Otalgia, bilateral: Secondary | ICD-10-CM | POA: Diagnosis not present

## 2019-08-09 NOTE — Telephone Encounter (Signed)
Called patient's husband, LMVM. Advising an [place] would not be useful for the patients skin lesion. Dr. Arita Miss sent in Bactrim DS on 08/05/19 for 14 days. She is to follow up with him on 08/26/19.

## 2019-08-12 DIAGNOSIS — Z1152 Encounter for screening for COVID-19: Secondary | ICD-10-CM | POA: Diagnosis not present

## 2019-08-26 ENCOUNTER — Ambulatory Visit: Payer: BC Managed Care – PPO | Admitting: Surgical

## 2019-09-01 ENCOUNTER — Institutional Professional Consult (permissible substitution): Payer: BC Managed Care – PPO | Admitting: Plastic Surgery

## 2020-01-26 DIAGNOSIS — T43205A Adverse effect of unspecified antidepressants, initial encounter: Secondary | ICD-10-CM | POA: Diagnosis not present

## 2020-01-26 DIAGNOSIS — R002 Palpitations: Secondary | ICD-10-CM | POA: Diagnosis not present

## 2020-01-26 DIAGNOSIS — M26629 Arthralgia of temporomandibular joint, unspecified side: Secondary | ICD-10-CM | POA: Diagnosis not present

## 2020-03-13 DIAGNOSIS — L564 Polymorphous light eruption: Secondary | ICD-10-CM | POA: Diagnosis not present

## 2020-04-06 ENCOUNTER — Ambulatory Visit: Payer: BC Managed Care – PPO | Attending: Critical Care Medicine

## 2020-04-06 DIAGNOSIS — Z20822 Contact with and (suspected) exposure to covid-19: Secondary | ICD-10-CM

## 2020-04-07 LAB — SARS-COV-2, NAA 2 DAY TAT

## 2020-04-07 LAB — NOVEL CORONAVIRUS, NAA: SARS-CoV-2, NAA: NOT DETECTED

## 2020-04-10 DIAGNOSIS — R252 Cramp and spasm: Secondary | ICD-10-CM | POA: Diagnosis not present

## 2020-04-10 DIAGNOSIS — M26629 Arthralgia of temporomandibular joint, unspecified side: Secondary | ICD-10-CM | POA: Diagnosis not present

## 2020-06-17 DIAGNOSIS — U071 COVID-19: Secondary | ICD-10-CM | POA: Diagnosis not present

## 2020-06-17 DIAGNOSIS — R509 Fever, unspecified: Secondary | ICD-10-CM | POA: Diagnosis not present

## 2020-06-17 DIAGNOSIS — R0981 Nasal congestion: Secondary | ICD-10-CM | POA: Diagnosis not present

## 2020-06-17 DIAGNOSIS — R059 Cough, unspecified: Secondary | ICD-10-CM | POA: Diagnosis not present

## 2020-08-16 DIAGNOSIS — Z1231 Encounter for screening mammogram for malignant neoplasm of breast: Secondary | ICD-10-CM | POA: Diagnosis not present

## 2020-08-16 DIAGNOSIS — Z01419 Encounter for gynecological examination (general) (routine) without abnormal findings: Secondary | ICD-10-CM | POA: Diagnosis not present

## 2020-08-16 DIAGNOSIS — Z681 Body mass index (BMI) 19 or less, adult: Secondary | ICD-10-CM | POA: Diagnosis not present

## 2020-08-28 DIAGNOSIS — M542 Cervicalgia: Secondary | ICD-10-CM | POA: Diagnosis not present

## 2020-08-28 DIAGNOSIS — N200 Calculus of kidney: Secondary | ICD-10-CM | POA: Diagnosis not present

## 2020-08-28 DIAGNOSIS — R42 Dizziness and giddiness: Secondary | ICD-10-CM | POA: Diagnosis not present

## 2020-08-28 DIAGNOSIS — H6983 Other specified disorders of Eustachian tube, bilateral: Secondary | ICD-10-CM | POA: Diagnosis not present

## 2020-08-28 DIAGNOSIS — Z0000 Encounter for general adult medical examination without abnormal findings: Secondary | ICD-10-CM | POA: Diagnosis not present

## 2020-09-05 DIAGNOSIS — H938X3 Other specified disorders of ear, bilateral: Secondary | ICD-10-CM | POA: Diagnosis not present

## 2020-09-05 DIAGNOSIS — M26609 Unspecified temporomandibular joint disorder, unspecified side: Secondary | ICD-10-CM | POA: Diagnosis not present

## 2020-09-06 DIAGNOSIS — N959 Unspecified menopausal and perimenopausal disorder: Secondary | ICD-10-CM | POA: Diagnosis not present

## 2020-09-06 DIAGNOSIS — N801 Endometriosis of ovary: Secondary | ICD-10-CM | POA: Diagnosis not present

## 2020-09-12 DIAGNOSIS — H903 Sensorineural hearing loss, bilateral: Secondary | ICD-10-CM | POA: Diagnosis not present

## 2020-09-12 DIAGNOSIS — R42 Dizziness and giddiness: Secondary | ICD-10-CM | POA: Diagnosis not present

## 2020-09-12 DIAGNOSIS — H9042 Sensorineural hearing loss, unilateral, left ear, with unrestricted hearing on the contralateral side: Secondary | ICD-10-CM | POA: Diagnosis not present

## 2020-09-12 DIAGNOSIS — M26609 Unspecified temporomandibular joint disorder, unspecified side: Secondary | ICD-10-CM | POA: Diagnosis not present

## 2020-09-20 NOTE — Progress Notes (Signed)
[name] [address] [place] [address] Phone: [phone] Subjective:   [name], am serving as a scribe for Dr. Antoine Primas. This visit occurred during the SARS-CoV-2 public health emergency.  Safety protocols were in place, including screening questions prior to the visit, additional usage of staff PPE, and extensive cleaning of exam room while observing appropriate contact time as indicated for disinfecting solutions.   I'm seeing this patient by the request  of:  Pcp, No  CC: Headache, neck and upper back.  NTI:[id number]  Diane Schultz is a 45 y.o. female coming in with complaint of L left numbness as well as headaches that seem to be coming from neck pain. Patient is also dizzy, off balance, has blurred vision. Patient states the dizzy started April 2022 and since she has started her TMJ treatments she feels one shoulder is always more anteriorly positioned than the other.  Patient has been seen and as well as audiology. Previous work-up includes a x-ray of the cervical spine from an outside facility.  Unable to see pictures but reviewed report stating that there was unremarkable bony changes.   Reviewing outside notes patient had some symptoms in [place] and was given oral steroids which did not help.  Patient also had an MRI of the brain while she was there that did not have lesions.  Past medical history is significant for migraines.  Patient has been referred to neurology but do not see a note Past Medical History:  Diagnosis Date   Infertility, female    No past surgical history on file. Social History   Socioeconomic History   Marital status: Married    Spouse name: Not on file   Number of children: Not on file   Years of education: Not on file   Highest education level: Not on file  Occupational History   Not on file  Tobacco Use   Smoking status: Never   Smokeless tobacco: Never  Vaping Use   Vaping Use: Never used   Substance and Sexual Activity   Alcohol use: Not Currently    Alcohol/week: 0.0 standard drinks    Comment: occasional   Drug use: No   Sexual activity: Yes    Birth control/protection: None  Other Topics Concern   Not on file  Social History Narrative   Not on file   Social Determinants of Health   Financial Resource Strain: Not on file  Food Insecurity: Not on file  Transportation Needs: Not on file  Physical Activity: Not on file  Stress: Not on file  Social Connections: Not on file   No Known Allergies Family History  Problem Relation Age of Onset   Hepatitis B Mother        Current Outpatient Medications (Analgesics):    HYDROcodone-acetaminophen (NORCO/VICODIN) 5-325 MG tablet, Take 1 tablet by mouth every 6 (six) hours as needed for severe pain.   ibuprofen (ADVIL) 600 MG tablet, Take 1 tablet (600 mg total) by mouth every 6 (six) hours as needed.   Current Outpatient Medications (Other):    cephALEXin (KEFLEX) 500 MG capsule, Take 500 mg by mouth 2 (two) times daily.   gabapentin (NEURONTIN) 100 MG capsule, Take 1 capsule (100 mg total) by mouth 3 (three) times daily.   ondansetron (ZOFRAN) 4 MG tablet, Take 1 tablet (4 mg total) by mouth every 8 (eight) hours as needed for nausea or vomiting.   Prenatal Vit-Fe Fumarate-FA (PRENATAL MULTIVITAMIN) TABS tablet, Take 1  tablet by mouth daily at 12 noon.   sulfamethoxazole-trimethoprim (BACTRIM DS) 800-160 MG tablet, Take 1 tablet by mouth 2 (two) times daily.   tamsulosin (FLOMAX) 0.4 MG CAPS capsule, Take 1 capsule (0.4 mg total) by mouth daily after supper.   Reviewed prior external information including notes and imaging from  primary care provider As well as notes that were available from care everywhere and other healthcare systems.  Past medical history, social, surgical and family history all reviewed in electronic medical record.  No pertanent information unless stated regarding to the chief complaint.    Review of Systems:  No  visual changes, nausea, vomiting, diarrhea, constipation, abdominal pain, skin rash, fevers, chills, night sweats, weight loss, swollen lymph nodes, body aches, joint swelling, chest pain, shortness of breath, mood changes. POSITIVE muscle aches, headache, intermittent dizziness  Objective  Blood pressure 96/68, pulse 62, height 5\' 4"  (1.626 m), weight 111 lb (50.3 kg), SpO2 97 %.   General: No apparent distress alert and oriented x3 mood and affect normal, dressed appropriately.  Patient is accompanied with husband. HEENT: Pupils equal, extraocular movements intact  Respiratory: Patient's speak in full sentences and does not appear short of breath  Cardiovascular: No lower extremity edema, non tender, no erythema  Gait normal with good balance and coordination.  MSK: \ Neck exam does have some loss of lordosis.  Patient does have tightness with sidebending bilaterally.  Lacks last 10 degrees of flexion 10 degrees of extension.  Negative Spurling's.  Scapular dyskinesis noted on the left side.  Osteopathic findings  C4 flexed rotated and side bent left C6 flexed rotated and side bent left T9 extended rotated and side bent left with inhaled rib      Impression and Recommendations:    The above documentation has been reviewed and is accurate and complete , DO

## 2020-09-22 ENCOUNTER — Encounter: Payer: Self-pay | Admitting: Family Medicine

## 2020-09-22 ENCOUNTER — Ambulatory Visit (INDEPENDENT_AMBULATORY_CARE_PROVIDER_SITE_OTHER): Payer: BC Managed Care – PPO | Admitting: Family Medicine

## 2020-09-22 ENCOUNTER — Other Ambulatory Visit: Payer: Self-pay

## 2020-09-22 VITALS — BP 96/68 | HR 62 | Ht 64.0 in | Wt 111.0 lb

## 2020-09-22 DIAGNOSIS — M9902 Segmental and somatic dysfunction of thoracic region: Secondary | ICD-10-CM

## 2020-09-22 DIAGNOSIS — M9908 Segmental and somatic dysfunction of rib cage: Secondary | ICD-10-CM

## 2020-09-22 DIAGNOSIS — M9901 Segmental and somatic dysfunction of cervical region: Secondary | ICD-10-CM

## 2020-09-22 DIAGNOSIS — G2589 Other specified extrapyramidal and movement disorders: Secondary | ICD-10-CM | POA: Diagnosis not present

## 2020-09-22 NOTE — Assessment & Plan Note (Addendum)
   Decision today to treat with OMT was based on Physical Exam  After verbal consent patient was treated with HVLA, ME, FPR techniques in cervical, thoracic, rib areas, all areas are chronic   Patient tolerated the procedure well with improvement in symptoms  Patient given exercises, stretches and lifestyle modifications  See medications in patient instructions if given  Patient will follow up in 4-6 weeks

## 2020-09-22 NOTE — Patient Instructions (Addendum)
Adjustable standing desk keep monitor eye level Vit D 2000iu  Tumeric 500mg  daily Refresh tears 2-3x a day You should be okay but if you experience worsening headache with nausea or vomiting seek medical attention Do prescribed exercises at least 3x a week See you again in 5 to 6 weeks

## 2020-09-22 NOTE — Assessment & Plan Note (Signed)
Patient does have what appears to be more of a scapular dyskinesis.  We discussed with patient at great length, discussed icing regimen and home exercises.  Discussed avoiding certain activities.  Discussed ergonomics and getting a standing desk.  Patient has had work-up including an MRI at a different country that was unremarkable.  Patient has had cervical x-rays that were also unremarkable.  Seems to be more muscular related.  Patient states that she has also had laboratory work-up for hormonal changes that was fairly unremarkable but if we continue to have difficulty I would consider further checking other labs.  Follow-up with me again 5 to 6 weeks

## 2020-10-13 DIAGNOSIS — M542 Cervicalgia: Secondary | ICD-10-CM | POA: Diagnosis not present

## 2020-10-19 ENCOUNTER — Encounter: Payer: Self-pay | Admitting: *Deleted

## 2020-10-19 ENCOUNTER — Other Ambulatory Visit: Payer: Self-pay | Admitting: *Deleted

## 2020-10-20 DIAGNOSIS — M542 Cervicalgia: Secondary | ICD-10-CM | POA: Diagnosis not present

## 2020-10-23 ENCOUNTER — Encounter: Payer: Self-pay | Admitting: Psychiatry

## 2020-10-23 ENCOUNTER — Ambulatory Visit (INDEPENDENT_AMBULATORY_CARE_PROVIDER_SITE_OTHER): Payer: BC Managed Care – PPO | Admitting: Psychiatry

## 2020-10-23 VITALS — BP 104/66 | HR 66 | Ht 64.0 in | Wt 111.0 lb

## 2020-10-23 DIAGNOSIS — R29898 Other symptoms and signs involving the musculoskeletal system: Secondary | ICD-10-CM | POA: Diagnosis not present

## 2020-10-23 DIAGNOSIS — R42 Dizziness and giddiness: Secondary | ICD-10-CM | POA: Diagnosis not present

## 2020-10-23 NOTE — Progress Notes (Signed)
[facility]  PATIENT: Diane Schultz DOB: Dec 11, 1975  REFERRING CLINICIAN: Spainhour, Kermit Balo, PA HISTORY FROM: self and husband REASON FOR VISIT: dizziness, headaches   HISTORICAL  CHIEF COMPLAINT:  Chief Complaint  Patient presents with   New Patient (Initial Visit)    Rm 1 with husband. Pt reports she is here to discuss worsening headaches and dizziness.     HISTORY OF PRESENT ILLNESS:  The patient presents for evaluation of headaches and dizziness. he has had brief episodes of light headedness since 2019. Will get tunnel vision and feel like she is going to pass out. Has never lost consciousness. Lasts for a couple of seconds then resolves, but can occur up to 5 times per day. This occurs most days and is sometimes associated with palpitations. Did a 24 hr Holter monitor in [place] and was told she has some kind of arrhythmia but is unsure what type it was. Notes her mother has a pacemaker and needed an ablation for a cardiac arrhythmia.  In April 2022 she developed headaches and a different type of dizziness described as a "floating sensation" which occurs when she turns her head. Dizziness is worse with turning her head either direction, maybe slightly worse when turning it to the right. Will sometimes feel dizzy when turning in her sleep. Sometimes has a sensation of fullness in her ears with it. This occurs nearly every day and can last all day. It is worse when she is active. Not associated with hearing loss. She is not dizzy today.  Headaches improved since she started using an orthotic for TMJ in June 2022. Currently she has a mild headache every 2-3 days lasting 30 minutes to 2 hours. Not associated with photophobia, phonophobia, or nausea. Resolves on its own without medications. It does not appear to be associated with the dizziness.  States she had a normal MRI brain done in [place] in September 2021.  OTHER MEDICAL CONDITIONS: n/a   REVIEW OF SYSTEMS:  Full 14 system review of systems performed and negative with exception of: headaches, dizziness  ALLERGIES: No Known Allergies  HOME MEDICATIONS: Outpatient Medications Prior to Visit  Medication Sig Dispense Refill   estradiol (CLIMARA - DOSED IN MG/24 HR) 0.025 mg/24hr patch 0.025 mg once a week.     ibuprofen (ADVIL) 600 MG tablet Take 1 tablet (600 mg total) by mouth every 6 (six) hours as needed. 30 tablet 0   Multiple Vitamin (MULTI-VITAMIN) tablet Take 1 tablet by mouth daily.     progesterone (PROMETRIUM) 100 MG capsule Take 100 mg by mouth daily.     sulfamethoxazole-trimethoprim (BACTRIM DS) 800-160 MG tablet Take 1 tablet by mouth 2 (two) times daily. 28 tablet 0   tamsulosin (FLOMAX) 0.4 MG CAPS capsule Take 1 capsule (0.4 mg total) by mouth daily after supper. 10 capsule 0   Acetaminophen 325 MG CAPS Tylenol 325 mg capsule  Take by oral route as needed.     celecoxib (CELEBREX) 200 MG capsule Take 200 mg by mouth as needed.     cephALEXin (KEFLEX) 500 MG capsule Take 500 mg by mouth 2 (two) times daily.     gabapentin (NEURONTIN) 100 MG capsule Take 1 capsule (100 mg total) by mouth 3 (three) times daily. 90 capsule 3   HYDROcodone-acetaminophen (NORCO/VICODIN) 5-325 MG tablet Take 1 tablet by mouth every 6 (six) hours as needed for severe pain. 10 tablet 0   ondansetron (ZOFRAN) 4 MG tablet Take 1 tablet (4 mg total) by  mouth every 8 (eight) hours as needed for nausea or vomiting. 10 tablet 0   Prenatal Vit-Fe Fumarate-FA (PRENATAL MULTIVITAMIN) TABS tablet Take 1 tablet by mouth daily at 12 noon.     No facility-administered medications prior to visit.    PAST MEDICAL HISTORY: Past Medical History:  Diagnosis Date   Dizziness    Infertility, female    Migraines     PAST SURGICAL HISTORY: History reviewed. No pertinent surgical history.  FAMILY HISTORY: Family History  Problem Relation Age of Onset   Hepatitis B Mother     SOCIAL HISTORY: Social History    Socioeconomic History   Marital status: Married    Spouse name: Not on file   Number of children: Not on file   Years of education: Not on file   Highest education level: Not on file  Occupational History   Not on file  Tobacco Use   Smoking status: Never   Smokeless tobacco: Never  Vaping Use   Vaping Use: Never used  Substance and Sexual Activity   Alcohol use: Not Currently    Alcohol/week: 0.0 standard drinks    Comment: occasional   Drug use: No   Sexual activity: Yes    Birth control/protection: None  Other Topics Concern   Not on file  Social History Narrative   Lives with husband   Right handed   Social Determinants of Health   Financial Resource Strain: Not on file  Food Insecurity: Not on file  Transportation Needs: Not on file  Physical Activity: Not on file  Stress: Not on file  Social Connections: Not on file  Intimate Partner Violence: Not on file     PHYSICAL EXAM  GENERAL EXAM/CONSTITUTIONAL: Vitals:  Vitals:   10/23/20 1252  BP: 104/66  Pulse: 66  SpO2: 99%  Weight: 111 lb (50.3 kg)  Height: 5\' 4"  (1.626 m)   Body mass index is 19.05 kg/m. Wt Readings from Last 3 Encounters:  10/23/20 111 lb (50.3 kg)  09/22/20 111 lb (50.3 kg)  08/05/19 125 lb (56.7 kg)   Patient is in no distress; well developed, nourished and groomed; neck is supple  CARDIOVASCULAR: Examination of peripheral vascular system by observation and palpation is normal  EYES: Pupils round and reactive to light, Visual fields full to confrontation, Extraocular movements intact  MUSCULOSKELETAL: Gait, strength, tone, movements noted in Neurologic exam below  NEUROLOGIC: MENTAL STATUS:  awake, alert, oriented to person, place and time recent and remote memory intact normal attention and concentration language fluent, comprehension intact, naming intact fund of knowledge appropriate  CRANIAL NERVE:  2nd, 3rd, 4th, 6th - pupils equal and reactive to light,  visual fields full to confrontation, extraocular muscles intact, no nystagmus 5th - facial sensation symmetric 7th - facial strength symmetric 8th - hearing intact 9th - palate elevates symmetrically, uvula midline 11th - shoulder shrug symmetric 12th - tongue protrusion midline   MOTOR:  4/5 strength in LLE hip flexion, otherwise 5/5 throughout  SENSORY:  Diminished sensation to light touch over left anterior thigh  COORDINATION:  finger-nose-finger, fine finger movements normal  REFLEXES:  deep tendon reflexes symmetrically brisk without spread   GAIT/STATION:  normal     DIAGNOSTIC DATA (LABS, IMAGING, TESTING) - I reviewed patient records, labs, notes, testing and imaging myself where available.  Lab Results  Component Value Date   WBC 8.4 02/04/2019   HGB 12.2 02/04/2019   HCT 36.6 02/04/2019   MCV 98.4 02/04/2019   PLT 207  02/04/2019      Component Value Date/Time   NA 134 (L) 02/04/2019 1545   K 3.5 02/04/2019 1545   CL 104 02/04/2019 1545   CO2 21 (L) 02/04/2019 1545   GLUCOSE 107 (H) 02/04/2019 1545   BUN 16 02/04/2019 1545   CREATININE 0.92 02/04/2019 1545   CREATININE 0.71 12/20/2013 1459   CALCIUM 9.1 02/04/2019 1545   PROT 7.8 02/04/2019 1545   ALBUMIN 4.7 02/04/2019 1545   AST 25 02/04/2019 1545   ALT 26 02/04/2019 1545   ALKPHOS 51 02/04/2019 1545   BILITOT 1.6 (H) 02/04/2019 1545   GFRNONAA >60 02/04/2019 1545   GFRAA >60 02/04/2019 1545   No results found for: CHOL, HDL, LDLCALC, LDLDIRECT, TRIG, CHOLHDL No results found for: OEVO3J No results found for: VITAMINB12 No results found for: TSH     ASSESSMENT AND PLAN  45 y.o. year old female without significant medical history who presents for evaluation of lightheadedness and disequilibrium. While she does endorse headaches, they are not associated with migrainous features and do not appear to coincide with her other symptoms. MRI brain is normal, will add vessel imaging to assess for  vascular stenosis. Referral to vestibular PT placed to help manage her symptoms of disequilibrium. Will also refer to Cardiology for evaluation for possible symptomatic arrhythmia as her episodes are associated with palpitations and she has a family history of arrhythmia requiring pacemaker.  Left leg weakness and numbness incidentally noted on today's neurological exam. Will order MRI L-spine to assess for lumbar stenosis.   1. Left leg weakness   2. Disequilibrium   3. Episodic lightheadedness       PLAN:  Dizziness/Disequilibrium -CTA head/neck -Vestibular rehab -Referral to Cardiology for arrhythmia evaluation  Left leg weakness/numbness -MRI L-spine  Orders Placed This Encounter  Procedures   MR LUMBAR SPINE WO CONTRAST   CT ANGIO HEAD W OR WO CONTRAST   CT ANGIO NECK W OR WO CONTRAST   Ambulatory referral to Cardiology   PT vestibular rehab     No orders of the defined types were placed in this encounter.   Return in about 6 months (around 04/23/2021).    Ocie Doyne, MD 10/23/20 1:47 pm  [facility] [address] [place], [place] [address] [phone]

## 2020-10-23 NOTE — Patient Instructions (Signed)
CT of blood vessels in the head and neck MRI of lower back Referral to vestibular therapy Referral to cardiology

## 2020-10-31 ENCOUNTER — Telehealth: Payer: Self-pay | Admitting: Psychiatry

## 2020-10-31 NOTE — Telephone Encounter (Signed)
[employer] auth for MR Lumbar: [id number] (exp. 10/31/20 to 11/29/20)  [name] Berkley Harvey: [id number] (exp. 10/31/20 to 11/29/20)  Order's sent to GI, they will reach out to the patient to schedule.

## 2020-10-31 NOTE — Progress Notes (Deleted)
  [name] [address] [place] [address] Phone: [phone] Subjective:    I'm seeing this patient by the request  of:  Josefine Class, MD  CC:   [id number]  Diane Schultz is a 45 y.o. female coming in with complaint of back and neck pain. OMT 09/22/2020. Patient states   Medications patient has been prescribed: None  Taking:         Reviewed prior external information including notes and imaging from previsou exam, outside providers and external EMR if available.   As well as notes that were available from care everywhere and other healthcare systems.  Past medical history, social, surgical and family history all reviewed in electronic medical record.  No pertanent information unless stated regarding to the chief complaint.   Past Medical History:  Diagnosis Date   Dizziness    Infertility, female    Migraines     No Known Allergies   Review of Systems:  No headache, visual changes, nausea, vomiting, diarrhea, constipation, dizziness, abdominal pain, skin rash, fevers, chills, night sweats, weight loss, swollen lymph nodes, body aches, joint swelling, chest pain, shortness of breath, mood changes. POSITIVE muscle aches  Objective  There were no vitals taken for this visit.   General: No apparent distress alert and oriented x3 mood and affect normal, dressed appropriately.  HEENT: Pupils equal, extraocular movements intact  Respiratory: Patient's speak in full sentences and does not appear short of breath  Cardiovascular: No lower extremity edema, non tender, no erythema  Neuro: Cranial nerves II through XII are intact, neurovascularly intact in all extremities with 2+ DTRs and 2+ pulses.  Gait normal with good balance and coordination.  MSK:  Non tender with full range of motion and good stability and symmetric strength and tone of shoulders, elbows, wrist, hip, knee and ankles bilaterally.  Back - Normal skin, Spine with  normal alignment and no deformity.  No tenderness to vertebral process palpation.  Paraspinous muscles are not tender and without spasm.   Range of motion is full at neck and lumbar sacral regions  Osteopathic findings  C2 flexed rotated and side bent right C6 flexed rotated and side bent left T3 extended rotated and side bent right inhaled rib T9 extended rotated and side bent left L2 flexed rotated and side bent right Sacrum right on right       Assessment and Plan:    Nonallopathic problems  Decision today to treat with OMT was based on Physical Exam  After verbal consent patient was treated with HVLA, ME, FPR techniques in cervical, rib, thoracic, lumbar, and sacral  areas  Patient tolerated the procedure well with improvement in symptoms  Patient given exercises, stretches and lifestyle modifications  See medications in patient instructions if given  Patient will follow up in 4-8 weeks      The above documentation has been reviewed and is accurate and complete Wilford Grist       Note: This dictation was prepared with Dragon dictation along with smaller phrase technology. Any transcriptional errors that result from this process are unintentional.

## 2020-11-01 ENCOUNTER — Ambulatory Visit: Payer: BC Managed Care – PPO | Admitting: Family Medicine

## 2020-11-03 DIAGNOSIS — M9905 Segmental and somatic dysfunction of pelvic region: Secondary | ICD-10-CM | POA: Diagnosis not present

## 2020-11-03 DIAGNOSIS — M9903 Segmental and somatic dysfunction of lumbar region: Secondary | ICD-10-CM | POA: Diagnosis not present

## 2020-11-03 DIAGNOSIS — M9904 Segmental and somatic dysfunction of sacral region: Secondary | ICD-10-CM | POA: Diagnosis not present

## 2020-11-03 DIAGNOSIS — M9901 Segmental and somatic dysfunction of cervical region: Secondary | ICD-10-CM | POA: Diagnosis not present

## 2020-11-03 DIAGNOSIS — M542 Cervicalgia: Secondary | ICD-10-CM | POA: Diagnosis not present

## 2020-11-03 DIAGNOSIS — M9907 Segmental and somatic dysfunction of upper extremity: Secondary | ICD-10-CM | POA: Diagnosis not present

## 2020-11-03 DIAGNOSIS — M9902 Segmental and somatic dysfunction of thoracic region: Secondary | ICD-10-CM | POA: Diagnosis not present

## 2020-11-06 DIAGNOSIS — M9903 Segmental and somatic dysfunction of lumbar region: Secondary | ICD-10-CM | POA: Diagnosis not present

## 2020-11-06 DIAGNOSIS — M9907 Segmental and somatic dysfunction of upper extremity: Secondary | ICD-10-CM | POA: Diagnosis not present

## 2020-11-06 DIAGNOSIS — M9901 Segmental and somatic dysfunction of cervical region: Secondary | ICD-10-CM | POA: Diagnosis not present

## 2020-11-06 DIAGNOSIS — M9902 Segmental and somatic dysfunction of thoracic region: Secondary | ICD-10-CM | POA: Diagnosis not present

## 2020-11-06 DIAGNOSIS — M9904 Segmental and somatic dysfunction of sacral region: Secondary | ICD-10-CM | POA: Diagnosis not present

## 2020-11-06 DIAGNOSIS — M9905 Segmental and somatic dysfunction of pelvic region: Secondary | ICD-10-CM | POA: Diagnosis not present

## 2020-11-08 DIAGNOSIS — M9905 Segmental and somatic dysfunction of pelvic region: Secondary | ICD-10-CM | POA: Diagnosis not present

## 2020-11-08 DIAGNOSIS — M9901 Segmental and somatic dysfunction of cervical region: Secondary | ICD-10-CM | POA: Diagnosis not present

## 2020-11-08 DIAGNOSIS — M9904 Segmental and somatic dysfunction of sacral region: Secondary | ICD-10-CM | POA: Diagnosis not present

## 2020-11-08 DIAGNOSIS — M9902 Segmental and somatic dysfunction of thoracic region: Secondary | ICD-10-CM | POA: Diagnosis not present

## 2020-11-08 DIAGNOSIS — M9903 Segmental and somatic dysfunction of lumbar region: Secondary | ICD-10-CM | POA: Diagnosis not present

## 2020-11-10 DIAGNOSIS — M9903 Segmental and somatic dysfunction of lumbar region: Secondary | ICD-10-CM | POA: Diagnosis not present

## 2020-11-10 DIAGNOSIS — M9901 Segmental and somatic dysfunction of cervical region: Secondary | ICD-10-CM | POA: Diagnosis not present

## 2020-11-10 DIAGNOSIS — M9905 Segmental and somatic dysfunction of pelvic region: Secondary | ICD-10-CM | POA: Diagnosis not present

## 2020-11-10 DIAGNOSIS — M9904 Segmental and somatic dysfunction of sacral region: Secondary | ICD-10-CM | POA: Diagnosis not present

## 2020-11-10 DIAGNOSIS — M9902 Segmental and somatic dysfunction of thoracic region: Secondary | ICD-10-CM | POA: Diagnosis not present

## 2020-11-13 DIAGNOSIS — M9905 Segmental and somatic dysfunction of pelvic region: Secondary | ICD-10-CM | POA: Diagnosis not present

## 2020-11-13 DIAGNOSIS — M9902 Segmental and somatic dysfunction of thoracic region: Secondary | ICD-10-CM | POA: Diagnosis not present

## 2020-11-13 DIAGNOSIS — M9903 Segmental and somatic dysfunction of lumbar region: Secondary | ICD-10-CM | POA: Diagnosis not present

## 2020-11-13 DIAGNOSIS — M9901 Segmental and somatic dysfunction of cervical region: Secondary | ICD-10-CM | POA: Diagnosis not present

## 2020-11-13 DIAGNOSIS — M9904 Segmental and somatic dysfunction of sacral region: Secondary | ICD-10-CM | POA: Diagnosis not present

## 2020-11-13 DIAGNOSIS — M9907 Segmental and somatic dysfunction of upper extremity: Secondary | ICD-10-CM | POA: Diagnosis not present

## 2020-11-15 DIAGNOSIS — M9905 Segmental and somatic dysfunction of pelvic region: Secondary | ICD-10-CM | POA: Diagnosis not present

## 2020-11-15 DIAGNOSIS — M9903 Segmental and somatic dysfunction of lumbar region: Secondary | ICD-10-CM | POA: Diagnosis not present

## 2020-11-15 DIAGNOSIS — M9904 Segmental and somatic dysfunction of sacral region: Secondary | ICD-10-CM | POA: Diagnosis not present

## 2020-11-15 DIAGNOSIS — M9901 Segmental and somatic dysfunction of cervical region: Secondary | ICD-10-CM | POA: Diagnosis not present

## 2020-11-15 DIAGNOSIS — M9902 Segmental and somatic dysfunction of thoracic region: Secondary | ICD-10-CM | POA: Diagnosis not present

## 2020-11-17 ENCOUNTER — Ambulatory Visit
Admission: RE | Admit: 2020-11-17 | Discharge: 2020-11-17 | Disposition: A | Discharge status: Home / Self Care | Payer: BC Managed Care – PPO | Source: Ambulatory Visit | Attending: Psychiatry | Admitting: Psychiatry

## 2020-11-17 DIAGNOSIS — R29898 Other symptoms and signs involving the musculoskeletal system: Secondary | ICD-10-CM | POA: Diagnosis not present

## 2020-11-17 DIAGNOSIS — M9902 Segmental and somatic dysfunction of thoracic region: Secondary | ICD-10-CM | POA: Diagnosis not present

## 2020-11-17 DIAGNOSIS — M9903 Segmental and somatic dysfunction of lumbar region: Secondary | ICD-10-CM | POA: Diagnosis not present

## 2020-11-17 DIAGNOSIS — M545 Low back pain, unspecified: Secondary | ICD-10-CM | POA: Diagnosis not present

## 2020-11-17 DIAGNOSIS — M9901 Segmental and somatic dysfunction of cervical region: Secondary | ICD-10-CM | POA: Diagnosis not present

## 2020-11-17 DIAGNOSIS — M9904 Segmental and somatic dysfunction of sacral region: Secondary | ICD-10-CM | POA: Diagnosis not present

## 2020-11-20 DIAGNOSIS — M9903 Segmental and somatic dysfunction of lumbar region: Secondary | ICD-10-CM | POA: Diagnosis not present

## 2020-11-20 DIAGNOSIS — M9907 Segmental and somatic dysfunction of upper extremity: Secondary | ICD-10-CM | POA: Diagnosis not present

## 2020-11-20 DIAGNOSIS — M9902 Segmental and somatic dysfunction of thoracic region: Secondary | ICD-10-CM | POA: Diagnosis not present

## 2020-11-20 DIAGNOSIS — M9905 Segmental and somatic dysfunction of pelvic region: Secondary | ICD-10-CM | POA: Diagnosis not present

## 2020-11-20 DIAGNOSIS — M9901 Segmental and somatic dysfunction of cervical region: Secondary | ICD-10-CM | POA: Diagnosis not present

## 2020-11-20 DIAGNOSIS — M9904 Segmental and somatic dysfunction of sacral region: Secondary | ICD-10-CM | POA: Diagnosis not present

## 2020-11-22 DIAGNOSIS — M9904 Segmental and somatic dysfunction of sacral region: Secondary | ICD-10-CM | POA: Diagnosis not present

## 2020-11-22 DIAGNOSIS — M9907 Segmental and somatic dysfunction of upper extremity: Secondary | ICD-10-CM | POA: Diagnosis not present

## 2020-11-22 DIAGNOSIS — M9905 Segmental and somatic dysfunction of pelvic region: Secondary | ICD-10-CM | POA: Diagnosis not present

## 2020-11-22 DIAGNOSIS — M9902 Segmental and somatic dysfunction of thoracic region: Secondary | ICD-10-CM | POA: Diagnosis not present

## 2020-11-22 DIAGNOSIS — M9903 Segmental and somatic dysfunction of lumbar region: Secondary | ICD-10-CM | POA: Diagnosis not present

## 2020-11-22 DIAGNOSIS — M9901 Segmental and somatic dysfunction of cervical region: Secondary | ICD-10-CM | POA: Diagnosis not present

## 2020-11-24 DIAGNOSIS — M9904 Segmental and somatic dysfunction of sacral region: Secondary | ICD-10-CM | POA: Diagnosis not present

## 2020-11-24 DIAGNOSIS — M9902 Segmental and somatic dysfunction of thoracic region: Secondary | ICD-10-CM | POA: Diagnosis not present

## 2020-11-24 DIAGNOSIS — M9901 Segmental and somatic dysfunction of cervical region: Secondary | ICD-10-CM | POA: Diagnosis not present

## 2020-11-24 DIAGNOSIS — M9905 Segmental and somatic dysfunction of pelvic region: Secondary | ICD-10-CM | POA: Diagnosis not present

## 2020-11-24 DIAGNOSIS — M9907 Segmental and somatic dysfunction of upper extremity: Secondary | ICD-10-CM | POA: Diagnosis not present

## 2020-11-24 DIAGNOSIS — M9903 Segmental and somatic dysfunction of lumbar region: Secondary | ICD-10-CM | POA: Diagnosis not present

## 2020-11-27 DIAGNOSIS — M9901 Segmental and somatic dysfunction of cervical region: Secondary | ICD-10-CM | POA: Diagnosis not present

## 2020-11-27 DIAGNOSIS — M9905 Segmental and somatic dysfunction of pelvic region: Secondary | ICD-10-CM | POA: Diagnosis not present

## 2020-11-27 DIAGNOSIS — M9902 Segmental and somatic dysfunction of thoracic region: Secondary | ICD-10-CM | POA: Diagnosis not present

## 2020-11-27 DIAGNOSIS — M9904 Segmental and somatic dysfunction of sacral region: Secondary | ICD-10-CM | POA: Diagnosis not present

## 2020-11-27 DIAGNOSIS — M9903 Segmental and somatic dysfunction of lumbar region: Secondary | ICD-10-CM | POA: Diagnosis not present

## 2020-11-29 DIAGNOSIS — M9904 Segmental and somatic dysfunction of sacral region: Secondary | ICD-10-CM | POA: Diagnosis not present

## 2020-11-29 DIAGNOSIS — M9907 Segmental and somatic dysfunction of upper extremity: Secondary | ICD-10-CM | POA: Diagnosis not present

## 2020-11-29 DIAGNOSIS — M9902 Segmental and somatic dysfunction of thoracic region: Secondary | ICD-10-CM | POA: Diagnosis not present

## 2020-11-29 DIAGNOSIS — M9901 Segmental and somatic dysfunction of cervical region: Secondary | ICD-10-CM | POA: Diagnosis not present

## 2020-11-29 DIAGNOSIS — M9905 Segmental and somatic dysfunction of pelvic region: Secondary | ICD-10-CM | POA: Diagnosis not present

## 2020-11-29 DIAGNOSIS — M9903 Segmental and somatic dysfunction of lumbar region: Secondary | ICD-10-CM | POA: Diagnosis not present

## 2020-12-01 DIAGNOSIS — M9901 Segmental and somatic dysfunction of cervical region: Secondary | ICD-10-CM | POA: Diagnosis not present

## 2020-12-01 DIAGNOSIS — M9907 Segmental and somatic dysfunction of upper extremity: Secondary | ICD-10-CM | POA: Diagnosis not present

## 2020-12-01 DIAGNOSIS — M9904 Segmental and somatic dysfunction of sacral region: Secondary | ICD-10-CM | POA: Diagnosis not present

## 2020-12-01 DIAGNOSIS — M9905 Segmental and somatic dysfunction of pelvic region: Secondary | ICD-10-CM | POA: Diagnosis not present

## 2020-12-01 DIAGNOSIS — M9902 Segmental and somatic dysfunction of thoracic region: Secondary | ICD-10-CM | POA: Diagnosis not present

## 2020-12-01 DIAGNOSIS — M9903 Segmental and somatic dysfunction of lumbar region: Secondary | ICD-10-CM | POA: Diagnosis not present

## 2020-12-04 DIAGNOSIS — M9905 Segmental and somatic dysfunction of pelvic region: Secondary | ICD-10-CM | POA: Diagnosis not present

## 2020-12-04 DIAGNOSIS — M9907 Segmental and somatic dysfunction of upper extremity: Secondary | ICD-10-CM | POA: Diagnosis not present

## 2020-12-04 DIAGNOSIS — M9904 Segmental and somatic dysfunction of sacral region: Secondary | ICD-10-CM | POA: Diagnosis not present

## 2020-12-04 DIAGNOSIS — M9901 Segmental and somatic dysfunction of cervical region: Secondary | ICD-10-CM | POA: Diagnosis not present

## 2020-12-04 DIAGNOSIS — M9902 Segmental and somatic dysfunction of thoracic region: Secondary | ICD-10-CM | POA: Diagnosis not present

## 2020-12-04 DIAGNOSIS — M9903 Segmental and somatic dysfunction of lumbar region: Secondary | ICD-10-CM | POA: Diagnosis not present

## 2020-12-06 DIAGNOSIS — M9902 Segmental and somatic dysfunction of thoracic region: Secondary | ICD-10-CM | POA: Diagnosis not present

## 2020-12-06 DIAGNOSIS — M9903 Segmental and somatic dysfunction of lumbar region: Secondary | ICD-10-CM | POA: Diagnosis not present

## 2020-12-06 DIAGNOSIS — M9901 Segmental and somatic dysfunction of cervical region: Secondary | ICD-10-CM | POA: Diagnosis not present

## 2020-12-06 DIAGNOSIS — M9905 Segmental and somatic dysfunction of pelvic region: Secondary | ICD-10-CM | POA: Diagnosis not present

## 2020-12-06 DIAGNOSIS — M9900 Segmental and somatic dysfunction of head region: Secondary | ICD-10-CM | POA: Diagnosis not present

## 2020-12-11 DIAGNOSIS — M9907 Segmental and somatic dysfunction of upper extremity: Secondary | ICD-10-CM | POA: Diagnosis not present

## 2020-12-11 DIAGNOSIS — M9900 Segmental and somatic dysfunction of head region: Secondary | ICD-10-CM | POA: Diagnosis not present

## 2020-12-11 DIAGNOSIS — M9905 Segmental and somatic dysfunction of pelvic region: Secondary | ICD-10-CM | POA: Diagnosis not present

## 2020-12-11 DIAGNOSIS — M9902 Segmental and somatic dysfunction of thoracic region: Secondary | ICD-10-CM | POA: Diagnosis not present

## 2020-12-11 DIAGNOSIS — M9901 Segmental and somatic dysfunction of cervical region: Secondary | ICD-10-CM | POA: Diagnosis not present

## 2020-12-13 DIAGNOSIS — M9907 Segmental and somatic dysfunction of upper extremity: Secondary | ICD-10-CM | POA: Diagnosis not present

## 2020-12-13 DIAGNOSIS — M9901 Segmental and somatic dysfunction of cervical region: Secondary | ICD-10-CM | POA: Diagnosis not present

## 2020-12-13 DIAGNOSIS — M9905 Segmental and somatic dysfunction of pelvic region: Secondary | ICD-10-CM | POA: Diagnosis not present

## 2020-12-13 DIAGNOSIS — M9900 Segmental and somatic dysfunction of head region: Secondary | ICD-10-CM | POA: Diagnosis not present

## 2020-12-13 DIAGNOSIS — M9903 Segmental and somatic dysfunction of lumbar region: Secondary | ICD-10-CM | POA: Diagnosis not present

## 2020-12-13 DIAGNOSIS — M9904 Segmental and somatic dysfunction of sacral region: Secondary | ICD-10-CM | POA: Diagnosis not present

## 2020-12-13 DIAGNOSIS — M9902 Segmental and somatic dysfunction of thoracic region: Secondary | ICD-10-CM | POA: Diagnosis not present

## 2020-12-18 DIAGNOSIS — M9905 Segmental and somatic dysfunction of pelvic region: Secondary | ICD-10-CM | POA: Diagnosis not present

## 2020-12-18 DIAGNOSIS — M9904 Segmental and somatic dysfunction of sacral region: Secondary | ICD-10-CM | POA: Diagnosis not present

## 2020-12-18 DIAGNOSIS — M9903 Segmental and somatic dysfunction of lumbar region: Secondary | ICD-10-CM | POA: Diagnosis not present

## 2020-12-18 DIAGNOSIS — M9902 Segmental and somatic dysfunction of thoracic region: Secondary | ICD-10-CM | POA: Diagnosis not present

## 2020-12-18 DIAGNOSIS — M9901 Segmental and somatic dysfunction of cervical region: Secondary | ICD-10-CM | POA: Diagnosis not present

## 2020-12-18 DIAGNOSIS — M9907 Segmental and somatic dysfunction of upper extremity: Secondary | ICD-10-CM | POA: Diagnosis not present

## 2020-12-20 DIAGNOSIS — M9902 Segmental and somatic dysfunction of thoracic region: Secondary | ICD-10-CM | POA: Diagnosis not present

## 2020-12-20 DIAGNOSIS — M9901 Segmental and somatic dysfunction of cervical region: Secondary | ICD-10-CM | POA: Diagnosis not present

## 2020-12-20 DIAGNOSIS — M9907 Segmental and somatic dysfunction of upper extremity: Secondary | ICD-10-CM | POA: Diagnosis not present

## 2020-12-20 DIAGNOSIS — M9905 Segmental and somatic dysfunction of pelvic region: Secondary | ICD-10-CM | POA: Diagnosis not present

## 2020-12-25 DIAGNOSIS — M9901 Segmental and somatic dysfunction of cervical region: Secondary | ICD-10-CM | POA: Diagnosis not present

## 2020-12-25 DIAGNOSIS — M9905 Segmental and somatic dysfunction of pelvic region: Secondary | ICD-10-CM | POA: Diagnosis not present

## 2020-12-25 DIAGNOSIS — M9902 Segmental and somatic dysfunction of thoracic region: Secondary | ICD-10-CM | POA: Diagnosis not present

## 2021-01-10 ENCOUNTER — Encounter (HOSPITAL_BASED_OUTPATIENT_CLINIC_OR_DEPARTMENT_OTHER): Payer: Self-pay

## 2021-01-23 ENCOUNTER — Encounter (HOSPITAL_BASED_OUTPATIENT_CLINIC_OR_DEPARTMENT_OTHER): Payer: Self-pay | Admitting: Cardiology

## 2021-01-23 ENCOUNTER — Ambulatory Visit (INDEPENDENT_AMBULATORY_CARE_PROVIDER_SITE_OTHER): Payer: BC Managed Care – PPO

## 2021-01-23 ENCOUNTER — Ambulatory Visit (INDEPENDENT_AMBULATORY_CARE_PROVIDER_SITE_OTHER): Payer: BC Managed Care – PPO | Admitting: Cardiology

## 2021-01-23 ENCOUNTER — Other Ambulatory Visit: Payer: Self-pay

## 2021-01-23 VITALS — BP 102/70 | HR 58 | Resp 20 | Ht 64.0 in | Wt 111.0 lb

## 2021-01-23 DIAGNOSIS — R002 Palpitations: Secondary | ICD-10-CM

## 2021-01-23 DIAGNOSIS — R42 Dizziness and giddiness: Secondary | ICD-10-CM | POA: Diagnosis not present

## 2021-01-23 DIAGNOSIS — Z7189 Other specified counseling: Secondary | ICD-10-CM

## 2021-01-23 NOTE — Progress Notes (Signed)
Cardiology Office Note:    Date:  01/23/2021   ID:  Diane Schultz, DOB January 12, 1976, MRN [id number]  PCP:  Josefine Class, MD  Cardiologist:  Jodelle Red, MD  Referring MD: Ocie Doyne, MD   CC: new patient consultation for arrhythmia and episodic lightheadedness  History of Present Illness:    Diane Schultz is a 46 y.o. female with a hx of migraines, who is seen as a new consult at the request of Ocie Doyne, MD for the evaluation and management of arrhythmia and episodic lightheadedness.  Diane Schultz saw Dr. Delena Bali on 10/23/2020 and reported episodes of headaches with associated dizziness. She was referred to cardiology for evaluation for possible symptomatic arrhythmia because her episodes are associated with palpitations. She also has a family history of arrhythmia requiring pacemaker.  Cardiovascular risk factors: Prior clinical ASCVD: None Comorbid conditions: She endorses renal calculi x2 in 2021. Metabolic syndrome/Obesity: Chronic inflammatory conditions: None Tobacco use history: None Family history: Her mother has a pacemaker, had ablation x2. Her mother reported to Diane Schultz that she experienced similar lightheadedness symptoms in her 67's. Prior cardiac testing and/or incidental findings on other testing (ie coronary calcium): Normal EKG at PCP 1 year ago, Holter monitor in 2021 while in [place] Exercise level: Walks her dog regularly. Current diet:  She states she does not typically feel palpitations, she mostly notices that her heart is beating quickly during the episodes. This may last about 30 seconds, and has occurred every day lately.  Also, she has persistent lightheadedness ongoing for over 2 years. These episodes are randomly intermittent, but may occur multiple times in a given day. Only rarely does she feel near-syncopal. At these times she "feels like she is in a dark elevator", and describes this darkness falling down over her  eyes.  Additionally, she complains of imbalance issues in the past year, feeling like she is floating such as when she is walking the dog.  Currently she reports having TMJ, and has neck and shoulder pain.  She endorses being in menopause.  She denies any chest pain, or shortness of breath. No headaches, orthopnea, PND, lower extremity edema or exertional symptoms.  A few years ago during the summer she developed a rash that was thought to be hives. She denies any rash recently.   Past Medical History:  Diagnosis Date   Dizziness    Infertility, female    Migraines     No past surgical history on file.  Current Medications: No current outpatient medications on file prior to visit.   No current facility-administered medications on file prior to visit.     Allergies:   Patient has no known allergies.   Social History   Tobacco Use   Smoking status: Never   Smokeless tobacco: Never  Vaping Use   Vaping Use: Never used  Substance Use Topics   Alcohol use: Not Currently    Alcohol/week: 0.0 standard drinks    Comment: occasional   Drug use: No    Family History: family history includes Hepatitis B in her mother.  ROS:   Please see the history of present illness.  Additional pertinent ROS: Constitutional: Negative for chills, fever, night sweats, unintentional weight loss  HENT: Negative for ear pain and hearing loss.   Eyes: Negative for loss of vision and eye pain.  Respiratory: Negative for cough, sputum, wheezing.   Cardiovascular: See HPI. Gastrointestinal: Negative for abdominal pain, melena, and hematochezia.  Genitourinary: Negative for dysuria and hematuria.  Musculoskeletal: Negative  for falls. Positive for neck and shoulder pain. Skin: Negative for itching.  Neurological: Negative for focal weakness, focal sensory changes. Positive for lightheadedness, near-syncope.  Endo/Heme/Allergies: Does not bruise/bleed easily.     EKGs/Labs/Other Studies  Reviewed:    The following studies were reviewed today:  No prior cardiovascular studies available.   EKG:  EKG is personally reviewed.   01/23/2021: sinus bradycardia at 58 bpm with iRBBB  Recent Labs: No results found for requested labs within last 8760 hours.   Recent Lipid Panel No results found for: CHOL, TRIG, HDL, CHOLHDL, VLDL, LDLCALC, LDLDIRECT  Physical Exam:    VS:  BP 102/70 (BP Location: Left Arm, Patient Position: Sitting, Cuff Size: Normal)   Pulse (!) 58   Resp 20   Ht 5\' 4"  (1.626 m)   Wt 111 lb (50.3 kg)   SpO2 99%   BMI 19.05 kg/m     Wt Readings from Last 3 Encounters:  01/23/21 111 lb (50.3 kg)  10/23/20 111 lb (50.3 kg)  09/22/20 111 lb (50.3 kg)    GEN: Well nourished, well developed in no acute distress HEENT: Normal, moist mucous membranes NECK: No JVD CARDIAC: regular rhythm, normal S1 and S2, no rubs or gallops. No murmur. VASCULAR: Radial and DP pulses 2+ bilaterally. No carotid bruits RESPIRATORY:  Clear to auscultation without rales, wheezing or rhonchi  ABDOMEN: Soft, non-tender, non-distended MUSCULOSKELETAL:  Ambulates independently SKIN: Warm and dry, no edema NEUROLOGIC:  Alert and oriented x 3. No focal neuro deficits noted. PSYCHIATRIC:  Normal affect    ASSESSMENT:    1. Palpitations   2. Intermittent lightheadedness   3. Cardiac risk counseling   4. Counseling on health promotion and disease prevention    PLAN:    Episodic lightheadedness Palpitations -will get event monitor to evaluate for arrhythmias -counseled on red flag warning signs that need immediate medical attention  Cardiac risk counseling and prevention recommendations: -recommend heart healthy/Mediterranean diet, with whole grains, fruits, vegetable, fish, lean meats, nuts, and olive oil. Limit salt. -recommend moderate walking, 3-5 times/week for 30-50 minutes each session. Aim for at least 150 minutes.week. Goal should be pace of 3 miles/hours, or  walking 1.5 miles in 30 minutes -recommend avoidance of tobacco products. Avoid excess alcohol. -ASCVD risk score: The ASCVD Risk score (Arnett DK, et al., 2019) failed to calculate for the following reasons:   Cannot find a previous HDL lab   Unable to determine if patient is Non-Hispanic African American    Plan for follow up: TBD following monitor results.  Jodelle Red, MD, PhD, [facility]  [facility]    Medication Adjustments/Labs and Tests Ordered: Current medicines are reviewed at length with the patient today.  Concerns regarding medicines are outlined above.   Orders Placed This Encounter  Procedures   LONG TERM MONITOR (3-14 DAYS)   EKG 12-Lead   No orders of the defined types were placed in this encounter.  Patient Instructions  Medication Instructions:  Your physician recommends that you continue on your current medications as directed. Please refer to the Current Medication list given to you today.  *If you need a refill on your cardiac medications before your next appointment, please call your pharmacy*   Lab Work: None If you have labs (blood work) drawn today and your tests are completely normal, you will receive your results only by: MyChart Message (if you have MyChart) OR A paper copy in the mail If you have any lab test that is  abnormal or we need to change your treatment, we will call you to review the results.   Testing/Procedures: Christena Deem- Long Term Monitor Instructions  Your physician has requested you wear a ZIO patch monitor for 14 days.  This is a single patch monitor. [employer] supplies one patch monitor per enrollment. Additional stickers are not available. Please do not apply patch if you will be having a Nuclear Stress Test,  Echocardiogram, Cardiac CT, MRI, or Chest Xray during the period you would be wearing the  monitor. The patch cannot be worn during these tests. You cannot remove and re-apply the  ZIO XT patch monitor.   Your ZIO patch monitor will be mailed 3 day USPS to your address on file. It may take 3-5 days  to receive your monitor after you have been enrolled.  Once you have received your monitor, please review the enclosed instructions. Your monitor  has already been registered assigning a specific monitor serial # to you.  Billing and Patient Assistance Program Information  We have supplied [employer] with any of your insurance information on file for billing purposes. [employer] offers a sliding scale Patient Assistance Program for patients that do not have  insurance, or whose insurance does not completely cover the cost of the ZIO monitor.  You must apply for the Patient Assistance Program to qualify for this discounted rate.  To apply, please call [employer] at [phone], select option 4, select option 2, ask to apply for  Patient Assistance Program. Meredeth Ide will ask your household income, and how many people  are in your household. They will quote your out-of-pocket cost based on that information.  [employer] will also be able to set up a 42-month, interest-free payment plan if needed.  Applying the monitor   Shave hair from upper left chest.  Hold abrader disc by orange tab. Rub abrader in 40 strokes over the upper left chest as  indicated in your monitor instructions.  Clean area with 4 enclosed alcohol pads. Let dry.  Apply patch as indicated in monitor instructions. Patch will be placed under collarbone on left  side of chest with arrow pointing upward.  Rub patch adhesive wings for 2 minutes. Remove white label marked "1". Remove the white  label marked "2". Rub patch adhesive wings for 2 additional minutes.  While looking in a mirror, press and release button in center of patch. A small green light will  flash 3-4 times. This will be your only indicator that the monitor has been turned on.  Do not shower for the first 24 hours. You may shower after the first 24 hours.  Press the button if  you feel a symptom. You will hear a small click. Record Date, Time and  Symptom in the Patient Logbook.  When you are ready to remove the patch, follow instructions on the last 2 pages of Patient  Logbook. Stick patch monitor onto the last page of Patient Logbook.  Place Patient Logbook in the blue and white box. Use locking tab on box and tape box closed  securely. The blue and white box has prepaid postage on it. Please place it in the mailbox as  soon as possible. Your physician should have your test results approximately 7 days after the  monitor has been mailed back to [facility].  Call [facility] Customer Care at [phone] if you have questions regarding  your ZIO XT patch monitor. Call them immediately if you see an orange light blinking on your  monitor.  If your monitor falls off in less than 4 days, contact our Monitor department at [phone].  If your monitor becomes loose or falls off after 4 days call [employer] at [phone] for  suggestions on securing your monitor    Follow-Up: At [facility], you and your health needs are our priority.  As part of our continuing mission to provide you with exceptional heart care, we have created designated Provider Care Teams.  These Care Teams include your primary Cardiologist (physician) and Advanced Practice Providers (APPs -  Physician Assistants and Nurse Practitioners) who all work together to provide you with the care you need, when you need it.   Your next appointment:   To be determined based on results }      I,Mathew Stumpf,acting as a scribe for [employer], MD.,have documented all relevant documentation on the behalf of Jodelle Red, MD,as directed by  Jodelle Red, MD while in the presence of Jodelle Red, MD.  I, Jodelle Red, MD, have reviewed all documentation for this visit. The documentation on 04/13/21 for the exam, diagnosis, procedures, and orders are  all accurate and complete.   Signed, Jodelle Red, MD PhD 01/23/2021     [facility]

## 2021-01-23 NOTE — Patient Instructions (Signed)
Medication Instructions:  Your physician recommends that you continue on your current medications as directed. Please refer to the Current Medication list given to you today.  *If you need a refill on your cardiac medications before your next appointment, please call your pharmacy*   Lab Work: None If you have labs (blood work) drawn today and your tests are completely normal, you will receive your results only by: [place] (if you have MyChart) OR A paper copy in the mail If you have any lab test that is abnormal or we need to change your treatment, we will call you to review the results.   Testing/Procedures: Bryn Gulling- Long Term Monitor Instructions  Your physician has requested you wear a ZIO patch monitor for 14 days.  This is a single patch monitor. [employer] supplies one patch monitor per enrollment. Additional stickers are not available. Please do not apply patch if you will be having a Nuclear Stress Test,  Echocardiogram, Cardiac CT, MRI, or Chest Xray during the period you would be wearing the  monitor. The patch cannot be worn during these tests. You cannot remove and re-apply the  ZIO XT patch monitor.  Your ZIO patch monitor will be mailed 3 day USPS to your address on file. It may take 3-5 days  to receive your monitor after you have been enrolled.  Once you have received your monitor, please review the enclosed instructions. Your monitor  has already been registered assigning a specific monitor serial # to you.  Billing and Patient Assistance Program Information  We have supplied [employer] with any of your insurance information on file for billing purposes. [employer] offers a sliding scale Patient Assistance Program for patients that do not have  insurance, or whose insurance does not completely cover the cost of the ZIO monitor.  You must apply for the Patient Assistance Program to qualify for this discounted rate.  To apply, please call [employer] at [phone], select  option 4, select option 2, ask to apply for  Patient Assistance Program. Theodore Demark will ask your household income, and how many people  are in your household. They will quote your out-of-pocket cost based on that information.  [employer] will also be able to set up a 95-month, interest-free payment plan if needed.  Applying the monitor   Shave hair from upper left chest.  Hold abrader disc by orange tab. Rub abrader in 40 strokes over the upper left chest as  indicated in your monitor instructions.  Clean area with 4 enclosed alcohol pads. Let dry.  Apply patch as indicated in monitor instructions. Patch will be placed under collarbone on left  side of chest with arrow pointing upward.  Rub patch adhesive wings for 2 minutes. Remove white label marked "1". Remove the white  label marked "2". Rub patch adhesive wings for 2 additional minutes.  While looking in a mirror, press and release button in center of patch. A small green light will  flash 3-4 times. This will be your only indicator that the monitor has been turned on.  Do not shower for the first 24 hours. You may shower after the first 24 hours.  Press the button if you feel a symptom. You will hear a small click. Record Date, Time and  Symptom in the Patient Logbook.  When you are ready to remove the patch, follow instructions on the last 2 pages of Patient  Logbook. Stick patch monitor onto the last page of Patient Logbook.  Place Patient Logbook in  the blue and white box. Use locking tab on box and tape box closed  securely. The blue and white box has prepaid postage on it. Please place it in the mailbox as  soon as possible. Your physician should have your test results approximately 7 days after the  monitor has been mailed back to [facility].  Call [facility] Customer Care at [phone] if you have questions regarding  your ZIO XT patch monitor. Call them immediately if you see an orange light blinking on your  monitor.   If your monitor falls off in less than 4 days, contact our Monitor department at [phone].  If your monitor becomes loose or falls off after 4 days call [employer] at [phone] for  suggestions on securing your monitor    Follow-Up: At [facility], you and your health needs are our priority.  As part of our continuing mission to provide you with exceptional heart care, we have created designated Provider Care Teams.  These Care Teams include your primary Cardiologist (physician) and Advanced Practice Providers (APPs -  Physician Assistants and Nurse Practitioners) who all work together to provide you with the care you need, when you need it.   Your next appointment:   To be determined based on results }

## 2021-01-24 DIAGNOSIS — G44219 Episodic tension-type headache, not intractable: Secondary | ICD-10-CM | POA: Diagnosis not present

## 2021-01-24 DIAGNOSIS — M26639 Articular disc disorder of temporomandibular joint, unspecified side: Secondary | ICD-10-CM | POA: Diagnosis not present

## 2021-01-24 DIAGNOSIS — M26623 Arthralgia of bilateral temporomandibular joint: Secondary | ICD-10-CM | POA: Diagnosis not present

## 2021-01-24 DIAGNOSIS — M7918 Myalgia, other site: Secondary | ICD-10-CM | POA: Diagnosis not present

## 2021-03-12 ENCOUNTER — Telehealth (HOSPITAL_BASED_OUTPATIENT_CLINIC_OR_DEPARTMENT_OTHER): Payer: Self-pay | Admitting: Cardiology

## 2021-03-12 NOTE — Telephone Encounter (Signed)
RN returned call to Pamala Hurry, and informed him that the preliminary report is in, just waiting for MD interpretation!   We will updated when it has resulted!

## 2021-03-12 NOTE — Telephone Encounter (Signed)
Follow Up:    Patient's husband would like to know if patient's Monitor results are ready please?

## 2021-03-21 DIAGNOSIS — Z1159 Encounter for screening for other viral diseases: Secondary | ICD-10-CM | POA: Diagnosis not present

## 2021-03-21 DIAGNOSIS — Z7184 Encounter for health counseling related to travel: Secondary | ICD-10-CM | POA: Diagnosis not present

## 2021-03-26 DIAGNOSIS — Z1152 Encounter for screening for COVID-19: Secondary | ICD-10-CM | POA: Diagnosis not present

## 2021-04-13 ENCOUNTER — Encounter (HOSPITAL_BASED_OUTPATIENT_CLINIC_OR_DEPARTMENT_OTHER): Payer: Self-pay | Admitting: Cardiology

## 2021-04-23 ENCOUNTER — Ambulatory Visit: Payer: BC Managed Care – PPO | Admitting: Psychiatry

## 2021-04-30 ENCOUNTER — Ambulatory Visit: Payer: BC Managed Care – PPO | Admitting: Psychiatry

## 2021-08-25 IMAGING — CT CT RENAL STONE PROTOCOL
2 of 4 series · 16 of 46 positions shown, 18 images · non-contrast
Comparison: None.

CLINICAL DATA: Left-sided flank pain for several days

EXAM:
CT ABDOMEN AND PELVIS WITHOUT CONTRAST
TECHNIQUE: Multidetector CT imaging of the abdomen and pelvis was performed
following the standard protocol without IV contrast.

[Series 2: axial st · axial · 0.62mm/px · z∈[-476,-61]mm · 13 of 91 slices shown, 15 images]
[im 4/91  soft-tissue]
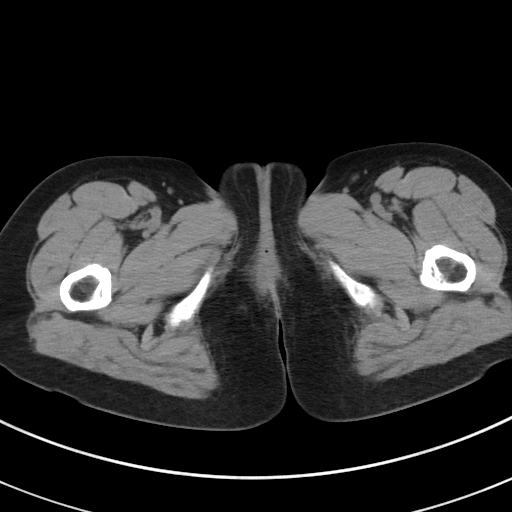
[im 4/91  bone]
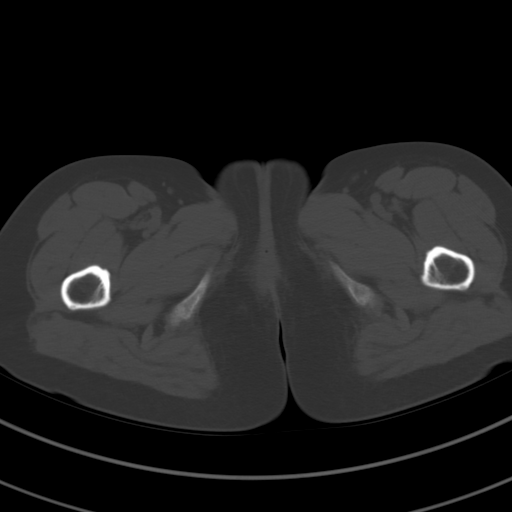
[im 11/91  soft-tissue]
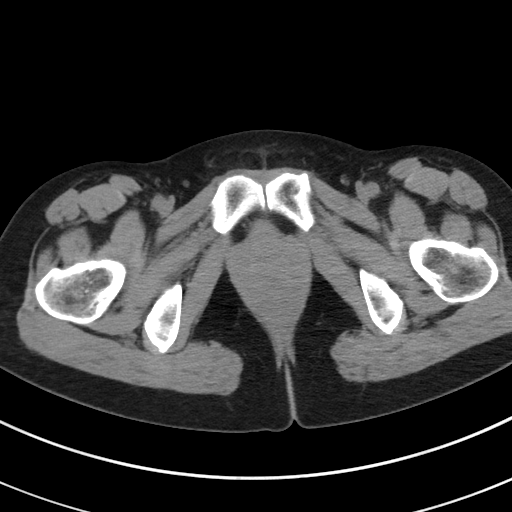
[im 18/91  soft-tissue]
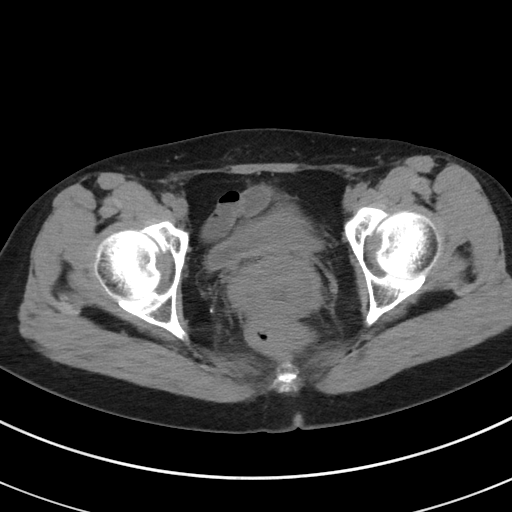
[im 25/91  soft-tissue]
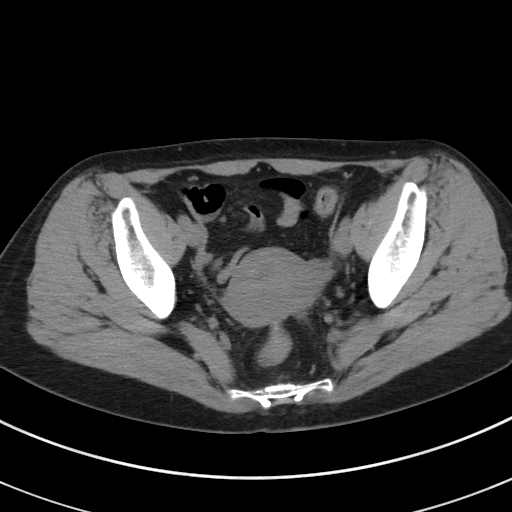
[im 32/91  soft-tissue]
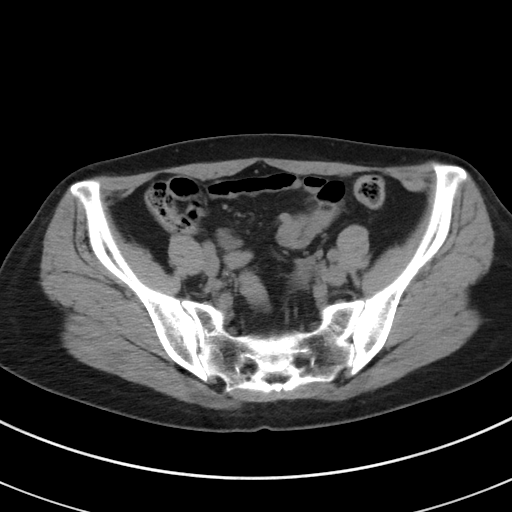
[im 39/91  soft-tissue]
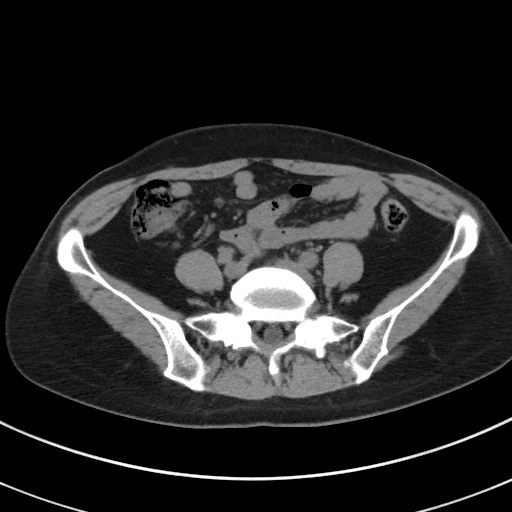
[im 46/91  soft-tissue]
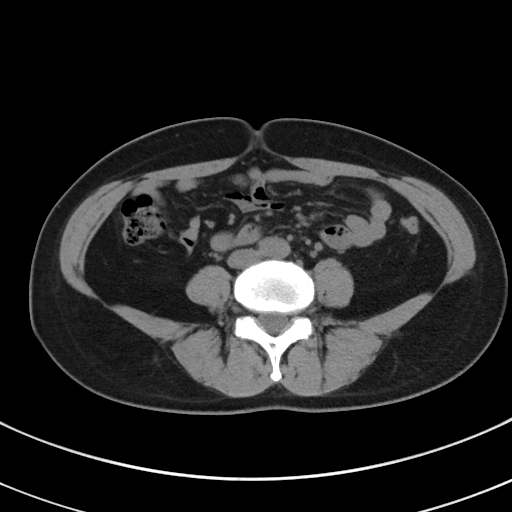
[im 52/91  soft-tissue]
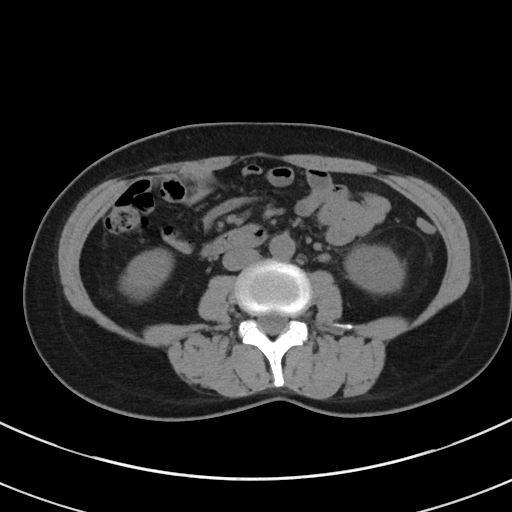
[im 59/91  soft-tissue]
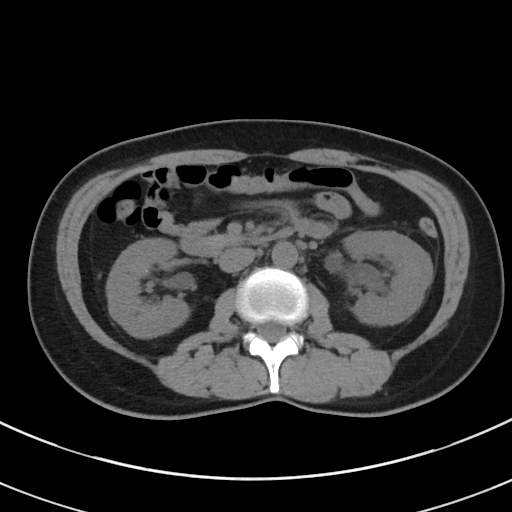
[im 59/91  bone]
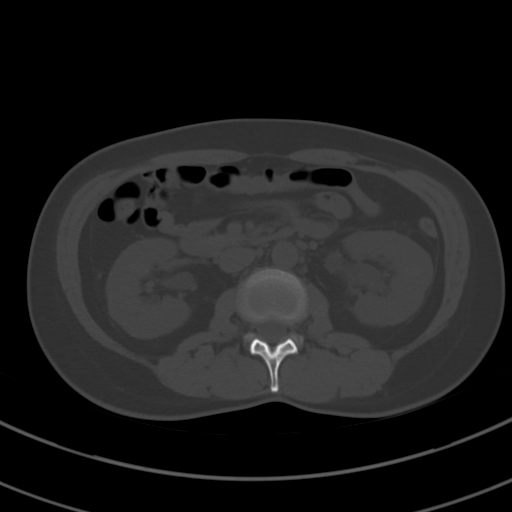
[im 66/91  soft-tissue]
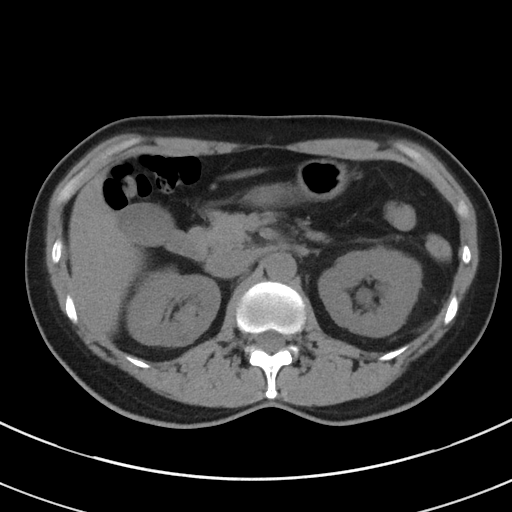
[im 73/91  soft-tissue]
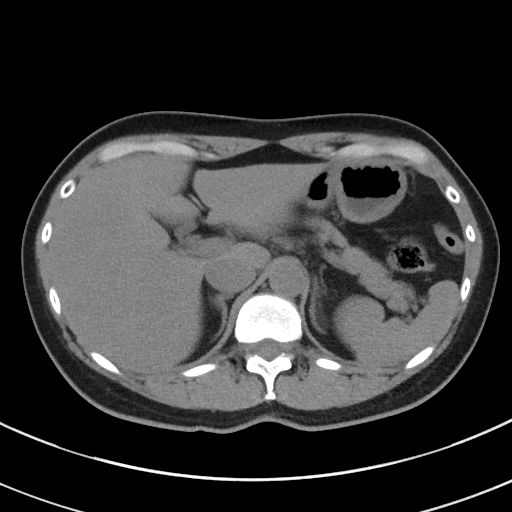
[im 80/91  soft-tissue]
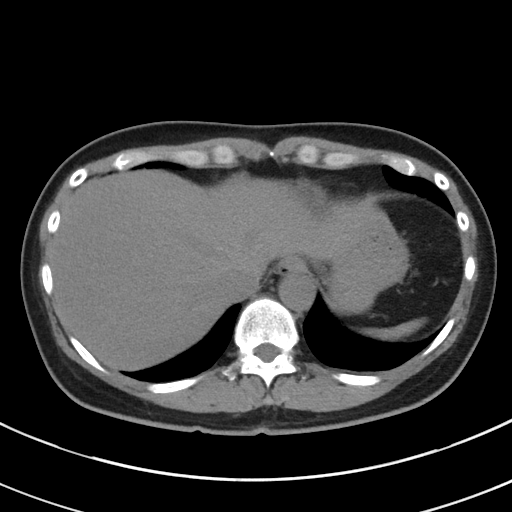
[im 87/91  soft-tissue]
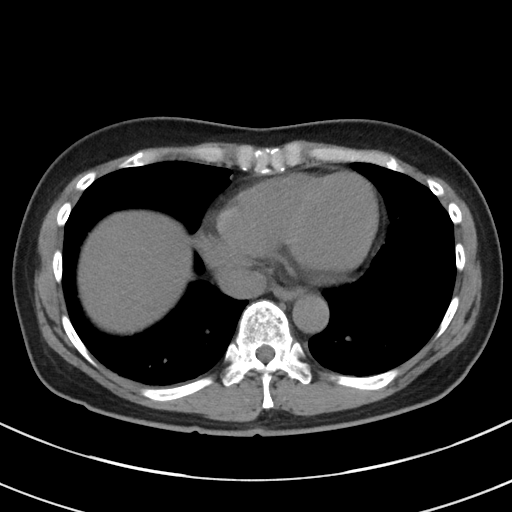

[Series 4: coronal st · coronal · 0.70mm/px · 3 of 83 slices shown]
[im 28/83  soft-tissue]
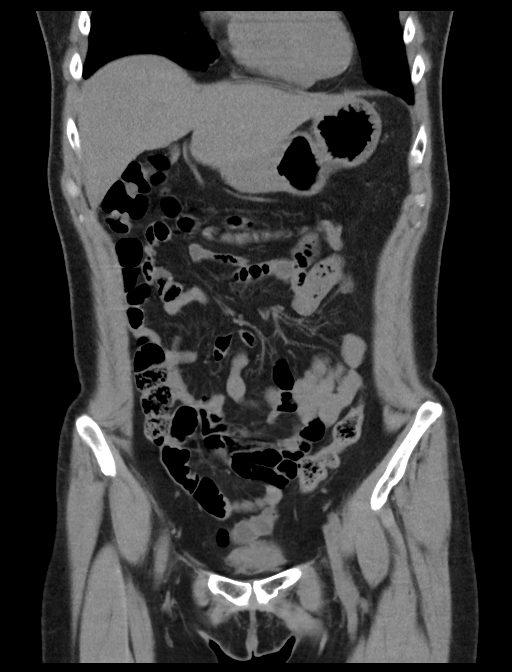
[im 37/83  soft-tissue]
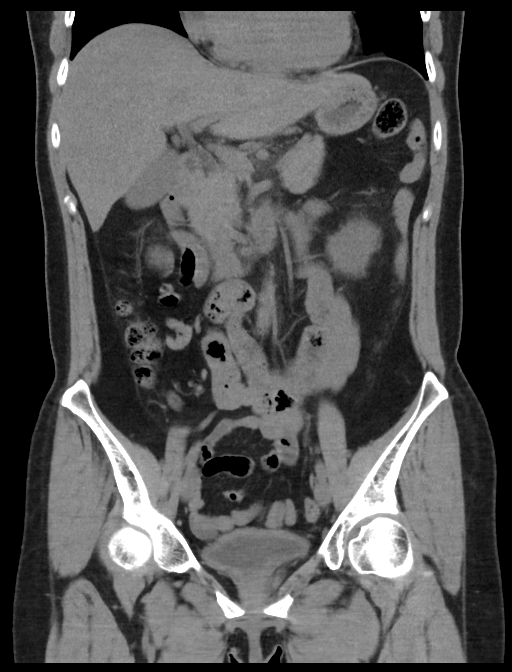
[im 46/83  soft-tissue]
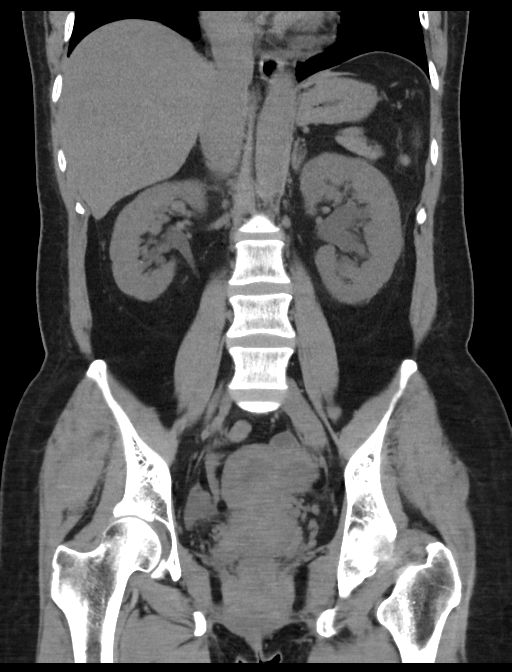

[16 of 46 positions shown; findings below may reference images not displayed]

FINDINGS: Lower chest: No acute abnormality.

Hepatobiliary: No focal liver abnormality is seen. No gallstones,
gallbladder wall thickening, or biliary dilatation.

Pancreas: Unremarkable. No pancreatic ductal dilatation or
surrounding inflammatory changes.

Spleen: Normal in size without focal abnormality.

Adrenals/Urinary Tract: Adrenal glands are within normal limits. The
left kidney demonstrates hydronephrosis and proximal hydroureter
secondary to a 5 mm proximal left ureteral stone best seen on image
number 41 of series 4 and image number 41 of series 2. Scattered
tiny nonobstructing renal calculi are noted bilaterally. The right
ureter is within normal limits. The bladder is decompressed.

Stomach/Bowel: The appendix is within normal limits. No obstructive
or inflammatory changes of the large or small bowel are seen. The
stomach is within normal limits.

Vascular/Lymphatic: No significant vascular findings are present. No
enlarged abdominal or pelvic lymph nodes.

Reproductive: Uterus is somewhat retroflexed. No adnexal mass is
noted.

Other: No abdominal wall hernia or abnormality. No abdominopelvic
ascites.

Musculoskeletal: No acute or significant osseous findings.
IMPRESSION: 5 mm proximal left ureteral stone with obstructive change.

Tiny nonobstructing renal stones bilaterally.

No other focal abnormality is noted.
